# Patient Record
Sex: Female | Born: 1975
Health system: Southern US, Community
[De-identification: ages and names within clinical notes are randomized; demographics above are authoritative.]

## PROBLEM LIST (undated history)

## (undated) DIAGNOSIS — E669 Obesity, unspecified: Secondary | ICD-10-CM

## (undated) DIAGNOSIS — G8929 Other chronic pain: Secondary | ICD-10-CM

## (undated) DIAGNOSIS — I10 Essential (primary) hypertension: Secondary | ICD-10-CM

## (undated) DIAGNOSIS — M1990 Unspecified osteoarthritis, unspecified site: Secondary | ICD-10-CM

## (undated) HISTORY — DX: Essential (primary) hypertension: I10

## (undated) HISTORY — DX: Obesity, unspecified: E66.9

## (undated) HISTORY — DX: Unspecified osteoarthritis, unspecified site: M19.90

## (undated) HISTORY — DX: Other chronic pain: G89.29

---

## 1997-12-24 ENCOUNTER — Other Ambulatory Visit: Admission: RE | Admit: 1997-12-24 | Discharge: 1997-12-24 | Payer: Self-pay | Admitting: Obstetrics

## 1997-12-24 ENCOUNTER — Ambulatory Visit (HOSPITAL_COMMUNITY): Admission: RE | Admit: 1997-12-24 | Discharge: 1997-12-24 | Payer: Self-pay | Admitting: Obstetrics

## 1998-03-13 ENCOUNTER — Inpatient Hospital Stay (HOSPITAL_COMMUNITY): Admission: AD | Admit: 1998-03-13 | Discharge: 1998-03-13 | Payer: Self-pay | Admitting: Obstetrics

## 1998-03-17 ENCOUNTER — Encounter (HOSPITAL_COMMUNITY): Admission: RE | Admit: 1998-03-17 | Discharge: 1998-03-19 | Payer: Self-pay | Admitting: Obstetrics

## 1998-03-18 ENCOUNTER — Inpatient Hospital Stay (HOSPITAL_COMMUNITY): Admission: AD | Admit: 1998-03-18 | Discharge: 1998-03-21 | Payer: Self-pay | Admitting: Obstetrics

## 1998-03-18 ENCOUNTER — Encounter: Payer: Self-pay | Admitting: Obstetrics

## 1998-03-21 ENCOUNTER — Encounter (HOSPITAL_COMMUNITY): Admission: RE | Admit: 1998-03-21 | Discharge: 1998-06-19 | Payer: Self-pay | Admitting: Obstetrics

## 2003-04-18 ENCOUNTER — Emergency Department (HOSPITAL_COMMUNITY): Admission: EM | Admit: 2003-04-18 | Discharge: 2003-04-18 | Payer: Self-pay | Admitting: Emergency Medicine

## 2005-06-08 ENCOUNTER — Emergency Department (HOSPITAL_COMMUNITY): Admission: EM | Admit: 2005-06-08 | Discharge: 2005-06-08 | Payer: Self-pay | Admitting: Emergency Medicine

## 2005-06-10 ENCOUNTER — Encounter: Admission: RE | Admit: 2005-06-10 | Discharge: 2005-06-10 | Payer: Self-pay | Admitting: Family Medicine

## 2005-06-20 ENCOUNTER — Encounter: Admission: RE | Admit: 2005-06-20 | Discharge: 2005-06-20 | Payer: Self-pay | Admitting: Emergency Medicine

## 2005-07-04 ENCOUNTER — Encounter: Admission: RE | Admit: 2005-07-04 | Discharge: 2005-07-04 | Payer: Self-pay | Admitting: Family Medicine
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
## 2017-09-30 ENCOUNTER — Other Ambulatory Visit: Payer: Self-pay

## 2017-09-30 ENCOUNTER — Ambulatory Visit (HOSPITAL_COMMUNITY)
Admission: EM | Admit: 2017-09-30 | Discharge: 2017-09-30 | Disposition: A | Discharge status: Home / Self Care | Payer: BLUE CROSS/BLUE SHIELD | Attending: Family Medicine | Admitting: Family Medicine

## 2017-09-30 ENCOUNTER — Encounter (HOSPITAL_COMMUNITY): Payer: Self-pay | Admitting: Emergency Medicine

## 2017-09-30 DIAGNOSIS — I10 Essential (primary) hypertension: Secondary | ICD-10-CM

## 2017-09-30 DIAGNOSIS — J019 Acute sinusitis, unspecified: Secondary | ICD-10-CM

## 2017-09-30 DIAGNOSIS — J069 Acute upper respiratory infection, unspecified: Secondary | ICD-10-CM | POA: Diagnosis not present

## 2017-09-30 MED ORDER — HYDROCOD POLST-CPM POLST ER 10-8 MG/5ML PO SUER: 5.0000 mL | Freq: Two times a day (BID) | ORAL | 0 refills | Status: AC | PRN

## 2017-09-30 NOTE — Discharge Instructions (Addendum)
Please see your PCP soon for BP management. It was elevated; likely because you were sick. Also you have viral URI. Use Tussionex as instructed. See us soon if symptoms worsens.  Note; Your repeat BP improved. I will recommend PCP follow-up on Monday. Monitor BP at home. If it remains elevated or you start having headache, or dizzy please go to the ED.

## 2017-09-30 NOTE — ED Provider Notes (Signed)
[facility]    CSN: [id number] Arrival date & time: 09/30/17  1215     History   Chief Complaint Chief Complaint  Patient presents with  . URI    HPI Sierra Diaz is a 42 y.o. female.   The history is provided by the patient. No language interpreter was used.  URI  Presenting symptoms: congestion and rhinorrhea   Presenting symptoms: no cough, no ear pain, no facial pain, no fever and no sore throat   Severity:  Moderate Onset quality:  Gradual Duration:  4 days Timing:  Intermittent (She had tooth aches few days ago, but not current. At work today, she developed some tingling on her lips, chest and fingers, but this has since improved a lot) Progression:  Waxing and waning Chronicity:  New Relieved by:  Nothing Worsened by:  Nothing Ineffective treatments: Dayquil, Nyquil. Associated symptoms: headaches, sinus pain and sneezing   Associated symptoms: no wheezing   Risk factors: sick contacts   Risk factors comment:  Her brother is also sick HTN: She has hx but had never been on meds.LMP: 2 weeks ago. Birth control: Abstinence.  History reviewed. No pertinent past medical history.  There are no active problems to display for this patient.   History reviewed. No pertinent surgical history.  OB History   None      Home Medications    Prior to Admission medications   Not on File    Family History Family History  Problem Relation Age of Onset  . Hypertension Mother   . Diabetes Mother   . Hypertension Father   . Diabetes Father   . Diabetes Brother   . Diabetes Brother     Social History Social History   Tobacco Use  . Smoking status: Never Smoker  Substance Use Topics  . Alcohol use: Never    Frequency: Never  . Drug use: Never     Allergies   Patient has no known allergies.   Review of Systems Review of Systems  Constitutional: Negative for fever.  HENT: Positive for congestion, rhinorrhea, sinus pain and sneezing.  Negative for ear pain and sore throat.   Respiratory: Negative for cough and wheezing.   Neurological: Positive for headaches.     Physical Exam Triage Vital Signs ED Triage Vitals  Enc Vitals Group     BP 09/30/17 1314 (!) 166/105     Pulse Rate 09/30/17 1314 94     Resp 09/30/17 1314 20     Temp 09/30/17 1314 98.1 F (36.7 C)     Temp Source 09/30/17 1314 Oral     SpO2 09/30/17 1314 100 %     Weight --      Height --      Head Circumference --      Peak Flow --      Pain Score 09/30/17 1318 3     Pain Loc --      Pain Edu? --      Excl. in GC? --    No data found.  Updated Vital Signs BP (!) 166/105 (BP Location: Left Arm)   Pulse 94   Temp 98.1 F (36.7 C) (Oral)   Resp 20   LMP 09/16/2017   SpO2 100%   Visual Acuity Right Eye Distance:   Left Eye Distance:   Bilateral Distance:    Right Eye Near:   Left Eye Near:    Bilateral Near:     Physical Exam  Constitutional:  She is oriented to person, place, and time. She appears well-developed. No distress.  HENT:  Head: Normocephalic.  Right Ear: Tympanic membrane and ear canal normal.  Left Ear: Tympanic membrane and ear canal normal.  Nose: Right sinus exhibits no maxillary sinus tenderness and no frontal sinus tenderness. Left sinus exhibits no maxillary sinus tenderness and no frontal sinus tenderness.  Mouth/Throat: Oropharynx is clear and moist and mucous membranes are normal. No tonsillar exudate.  Eyes: Pupils are equal, round, and reactive to light. EOM are normal.  Neck: Neck supple.  Cardiovascular: Normal rate, regular rhythm and normal heart sounds.  No murmur heard. Pulmonary/Chest: Effort normal and breath sounds normal. No stridor. No respiratory distress. She has no wheezes.  Abdominal: Soft. Bowel sounds are normal. She exhibits no distension.  Lymphadenopathy:    She has no cervical adenopathy.  Neurological: She is alert and oriented to person, place, and time. No cranial nerve deficit.       UC Treatments / Results  Labs (all labs ordered are listed, but only abnormal results are displayed) Labs Reviewed - No data to display  EKG None  Radiology No results found.  Procedures Procedures (including critical care time)  Medications Ordered in UC Medications - No data to display  Initial Impression / Assessment and Plan / UC Course  I have reviewed the triage vital signs and the nursing notes.  Pertinent labs & imaging results that were available during my care of the patient were reviewed by me and considered in my medical decision making (see chart for details).  Clinical Course as of Oct 01 1411  Sat Sep 30, 2017  1345 Likely viral URI and acute viral sinusitis Tussionex prn cough and nasal congestion. Patient reassured that A/B is not needed at this time. Monitor closely for improvement. F/U soon if worsening.   [KE]  1346 HTN BP elevated today, likely worsened due to her illness. Repeat BP: Improved a little but still diastolic HTN. As discussed with her, this could be due to her acute viral illness and sinusitis. I worry that starting her on antihypertensive now that she is sick might not be the best option as her BP might drop low when she start to feel well. She is to call PCP on Monday for reassessment. Monitor BP closely at home over the weekend, if it remains elevated or she is having headache, or cardiac symptoms, she is advised to go to the ED. She prefers this plan and agreed with it.   [KE]    Clinical Course User Index [KE] Doreene Eland, MD    Viral upper respiratory tract infection  Acute sinusitis, recurrence not specified, unspecified location  Benign essential HTN   Final Clinical Impressions(s) / UC Diagnoses   Final diagnoses:  None   Discharge Instructions   None    ED Prescriptions    None     Controlled Substance Prescriptions [place] Controlled Substance Registry consulted? Not Applicable   Doreene Eland, MD 09/30/17 1413

## 2017-09-30 NOTE — ED Triage Notes (Signed)
Onset Tuesday afternoon of sniffles, progressed to sinus congestion, headache, teeth ache, tingling lips and fingers.  Feels tightness in chest intermittently, sob

## 2018-01-08 DIAGNOSIS — D649 Anemia, unspecified: Secondary | ICD-10-CM | POA: Diagnosis not present

## 2018-01-08 DIAGNOSIS — Z6841 Body Mass Index (BMI) 40.0 and over, adult: Secondary | ICD-10-CM | POA: Diagnosis not present

## 2018-01-08 DIAGNOSIS — N390 Urinary tract infection, site not specified: Secondary | ICD-10-CM | POA: Diagnosis not present

## 2018-01-08 DIAGNOSIS — Z3202 Encounter for pregnancy test, result negative: Secondary | ICD-10-CM | POA: Diagnosis not present

## 2018-01-08 DIAGNOSIS — Z01419 Encounter for gynecological examination (general) (routine) without abnormal findings: Secondary | ICD-10-CM | POA: Diagnosis not present

## 2018-01-10 LAB — HM PAP SMEAR: HM Pap smear: NEGATIVE

## 2018-02-01 DIAGNOSIS — Z1231 Encounter for screening mammogram for malignant neoplasm of breast: Secondary | ICD-10-CM | POA: Diagnosis not present

## 2018-02-01 DIAGNOSIS — N939 Abnormal uterine and vaginal bleeding, unspecified: Secondary | ICD-10-CM | POA: Diagnosis not present

## 2018-02-16 DIAGNOSIS — N939 Abnormal uterine and vaginal bleeding, unspecified: Secondary | ICD-10-CM | POA: Diagnosis not present

## 2018-02-16 DIAGNOSIS — Z3009 Encounter for other general counseling and advice on contraception: Secondary | ICD-10-CM | POA: Diagnosis not present

## 2018-02-16 DIAGNOSIS — D649 Anemia, unspecified: Secondary | ICD-10-CM | POA: Diagnosis not present

## 2018-03-21 DIAGNOSIS — Z30430 Encounter for insertion of intrauterine contraceptive device: Secondary | ICD-10-CM | POA: Diagnosis not present

## 2018-03-21 DIAGNOSIS — N939 Abnormal uterine and vaginal bleeding, unspecified: Secondary | ICD-10-CM | POA: Diagnosis not present

## 2018-05-03 DIAGNOSIS — Z30431 Encounter for routine checking of intrauterine contraceptive device: Secondary | ICD-10-CM | POA: Diagnosis not present

## 2018-09-13 ENCOUNTER — Ambulatory Visit (HOSPITAL_COMMUNITY)
Admission: EM | Admit: 2018-09-13 | Discharge: 2018-09-13 | Disposition: A | Discharge status: Home / Self Care | Payer: BC Managed Care – PPO | Attending: Internal Medicine | Admitting: Internal Medicine

## 2018-09-13 ENCOUNTER — Other Ambulatory Visit: Payer: Self-pay

## 2018-09-13 ENCOUNTER — Encounter (HOSPITAL_COMMUNITY): Payer: Self-pay

## 2018-09-13 DIAGNOSIS — M779 Enthesopathy, unspecified: Secondary | ICD-10-CM

## 2018-09-13 DIAGNOSIS — M25561 Pain in right knee: Secondary | ICD-10-CM | POA: Diagnosis not present

## 2018-09-13 DIAGNOSIS — G8929 Other chronic pain: Secondary | ICD-10-CM

## 2018-09-13 MED ORDER — DICLOFENAC SODIUM 1 % TD GEL: 2.0000 g | Freq: Four times a day (QID) | TRANSDERMAL | 0 refills | Status: DC

## 2018-09-13 NOTE — ED Triage Notes (Signed)
Patient presents to Urgent Care with complaints of right knee pain since a few weeks ago. Patient reports she walks different to try and ease the pain in her knee and now other things are beginning to hurt as well, such as her ankle. Pt has knee brace on at this time.

## 2018-09-13 NOTE — Discharge Instructions (Addendum)
Patient to rest, ice, compress, elevate area of concern throughout the day. Work note provided to decrease time on joint as this can exacerbate swelling and pain. Follow-up with PCP regarding your elevated blood pressure and to monitor right knee pain. Continue Tylenol OTC, use diclofenac topically for additional relief.  Can also use knee compression sleeve for additional support, compression, pain relief.

## 2018-09-13 NOTE — ED Provider Notes (Signed)
[facility]    CSN: [id number] Arrival date & time: 09/13/18  1327     History   Chief Complaint Chief Complaint  Patient presents with  . Knee Pain    HPI Sierra Diaz is a 43 y.o. female presenting for right knee pain.  Patient states she has had chronic knee pain, though for the last 2 weeks has had worsening.  Patient states that she is developed right upper calf pain as well over the last 2 weeks.  Patient denies fall, trauma to the area, rash, joint redness, warmth.  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  Patient denies lower extremity paresthesias, numbness, abnormal gait.  Patient bought a over-the-counter knee brace which is provided some relief.  Of note, patient reports adverse effect of NSAIDs.  States that this elevates her blood pressure, which is already elevated today.  Patient states that she is in the process of establishing care, has not been on any antihypertensive medication before.  Denies alarm symptoms such as chest pain, shortness of breath, abdominal or back pain, headache.   No past medical history on file.  There are no active problems to display for this patient.   No past surgical history on file.  OB History   No obstetric history on file.      Home Medications    Prior to Admission medications   Medication Sig Start Date End Date Taking? Authorizing Provider  ferrous sulfate 325 (65 FE) MG tablet Take 325 mg by mouth daily with breakfast.   Yes [provider]  diclofenac sodium (VOLTAREN) 1 % GEL Apply 2 g topically 4 (four) times daily. 09/13/18   Hall-Potvin, [place], PA-C    Family History Family History  Problem Relation Age of Onset  . Hypertension Mother   . Diabetes Mother   . Hypertension Father   . Diabetes Father   . Diabetes Brother   . Diabetes Brother     Social  History Social History   Tobacco Use  . Smoking status: Never Smoker  Substance Use Topics  . Alcohol use: Never    Frequency: Never  . Drug use: Never     Allergies   Ibuprofen   Review of Systems As per HPI   Physical Exam Triage Vital Signs ED Triage Vitals  Enc Vitals Group     BP      Pulse      Resp      Temp      Temp src      SpO2      Weight      Height      Head Circumference      Peak Flow      Pain Score      Pain Loc      Pain Edu?      Excl. in GC?    No data found.  Updated Vital Signs BP (!) 156/112 (BP Location: Left Arm) Comment: pt was supposed to see PCP to be put on BP meds but did not follow up  Pulse 89   Temp 98.5 F (36.9 C) (Oral)   Resp 18   LMP 09/09/2018 (Exact Date)   SpO2 100%   Visual Acuity Right Eye Distance:   Left Eye Distance:   Bilateral Distance:    Right Eye  Near:   Left Eye Near:    Bilateral Near:     Physical Exam Constitutional:      General: She is not in acute distress.    Appearance: She is obese.  HENT:     Head: Normocephalic and atraumatic.  Eyes:     General: No scleral icterus.    Pupils: Pupils are equal, round, and reactive to light.  Cardiovascular:     Rate and Rhythm: Normal rate.  Pulmonary:     Effort: Pulmonary effort is normal.  Musculoskeletal:     Comments: Right knee with mild edema, negative blotting test.  No significant effusion, anterior patellar tenderness.  Patient has full active ROM without significant pain.  Mild crepitus of right knee.  DP pulses 2+ bilaterally and symmetric.  Patient endorsing tenderness to palpation of right superolateral calf.  Knee joint is stable, negative anterior drawer test.  Patient has normal gait with even distribution of weight through foot.  Skin:    Coloration: Skin is not jaundiced or pale.  Neurological:     Mental Status: She is alert and oriented to person, place, and time.      UC Treatments / Results  Labs (all labs ordered  are listed, but only abnormal results are displayed) Labs Reviewed - No data to display  EKG None  Radiology No results found.  Procedures Procedures (including critical care time)  Medications Ordered in UC Medications - No data to display  Initial Impression / Assessment and Plan / UC Course  I have reviewed the triage vital signs and the nursing notes.  Pertinent labs & imaging results that were available during my care of the patient were reviewed by me and considered in my medical decision making (see chart for details).     43 year old female with history of chronic knee pain bilaterally presenting for worsening pain of right knee and calf.  Likely tendinitis.  Exam reassuring, no indication for x-ray at this time.  Patient given knee compression sleeve, work restrictions, and will practice rest, ice, compression, elevation.  Patient to follow-up with PCP regarding hypertension and to monitor course of knee pain. Final Clinical Impressions(s) / UC Diagnoses   Final diagnoses:  Chronic pain of right knee  Tendonitis     Discharge Instructions     Patient to rest, ice, compress, elevate area of concern throughout the day. Work note provided to decrease time on joint as this can exacerbate swelling and pain. Follow-up with PCP regarding your elevated blood pressure and to monitor right knee pain. Continue Tylenol OTC, use diclofenac topically for additional relief.  Can also use knee compression sleeve for additional support, compression, pain relief.    ED Prescriptions    Medication Sig Dispense Auth. Provider   diclofenac sodium (VOLTAREN) 1 % GEL Apply 2 g topically 4 (four) times daily. 100 g Hall-Potvin, [place], PA-C     Controlled Substance Prescriptions [place] Controlled Substance Registry consulted? Not Applicable   Quincy Sheehan, [place] 09/13/18 1454

## 2018-09-23 DIAGNOSIS — M25561 Pain in right knee: Secondary | ICD-10-CM | POA: Diagnosis not present

## 2018-11-07 ENCOUNTER — Encounter: Payer: Self-pay | Admitting: Family Medicine

## 2018-11-19 ENCOUNTER — Encounter: Payer: Self-pay | Admitting: Family Medicine

## 2018-11-19 ENCOUNTER — Ambulatory Visit (INDEPENDENT_AMBULATORY_CARE_PROVIDER_SITE_OTHER): Payer: BC Managed Care – PPO | Admitting: Family Medicine

## 2018-11-19 ENCOUNTER — Other Ambulatory Visit: Payer: Self-pay

## 2018-11-19 DIAGNOSIS — I10 Essential (primary) hypertension: Secondary | ICD-10-CM

## 2018-11-19 DIAGNOSIS — G8929 Other chronic pain: Secondary | ICD-10-CM

## 2018-11-19 DIAGNOSIS — M25561 Pain in right knee: Secondary | ICD-10-CM

## 2018-11-19 MED ORDER — AMLODIPINE BESYLATE 5 MG PO TABS: 5.0000 mg | ORAL_TABLET | Freq: Every day | ORAL | 3 refills | Status: DC

## 2018-11-19 NOTE — Progress Notes (Signed)
Following up on urgent care visit from June 2020. States that her knee pain has gotten better. Previously had constant pain with or without movement. Now only has pain if she's moving around for long periods of time. Has been wearing a knee brace.

## 2018-11-19 NOTE — Progress Notes (Signed)
Virtual Visit via Telephone Note  I connected with Sierra Diaz 11/19/18 at  1:50 PM EDT by telephone and verified that I am speaking with the correct person using two identifiers.   I discussed the limitations, risks, security and privacy concerns of performing an evaluation and management service by telephone and the availability of in person appointments. I also discussed with the patient that there may be a patient responsible charge related to this service. The patient expressed understanding and agreed to proceed.  Patient Location: Home Provider Location: Office at [facility] Others participating in call: Call was initiated by Adrian Prince, CMA who then transferred the call to me   History of Present Illness:        43 yo female who states that she is establishing care in follow-up of an urgent care visit in June of this year when she was seen for knee pain.  Patient reports that she has had recurrent issues with pain in the right knee and has been diagnosed with tendinitis in the past.  She states that she was having constant pain but now her knee pain occurs mostly after she has been sitting/not using her knee and then when she tries to get up to start walking again she has onset of pain.  Pain is located in the right inside of the knee and slightly behind the knee.  Generally pain is a dull aching sensation but can sometimes be sharp.  Current pain is about a 4-5 on a 0-to-10 scale.  Pain has been greater in the past.  She has been wearing a knee sleeve prescribed at the emergency department.          Patient states that she has been taking over-the-counter Aleve and Tylenol but stopped using these as they really were not helpful.  Patient states that in the past she took ibuprofen which did help with her knee pain but this would also tend to cause her to have a headache and to have elevations in her blood pressure.  Patient reports that at her last emergency department/urgent care visit  for her knee she was told that her blood pressure was elevated and that she needed to establish follow-up with her primary care provider.  She reports that she has had elevated blood pressure in the past but has never been on medication to help control her blood pressure.  She did try the use of diclofenac gel prescribed at at her most recent hospital visit about her knee pain but she states that this was not effective and she is no longer using this medication.        She does have past history of anemia secondary to blood loss related to menses.  She does still take iron on occasion but has had no recent follow-up regarding anemia and she states that her bleeding has improved.  She does have an IUD in place.  She denies any other significant medical history other than her elevated blood pressure and recurrent knee pain.  Past surgical history consist of C-section approximately 20 years ago.  She does have family history significant for both mother and father with diabetes and hypertension.   Past Medical History:  Diagnosis Date  . Chronic knee pain   . Obesity     Past Surgical History:  Procedure Laterality Date  . CESAREAN SECTION      Family History  Problem Relation Age of Onset  . Hypertension Mother   . Diabetes Mother   .  Hypertension Father   . Diabetes Father   . Diabetes Brother   . Diabetes Brother     Social History   Tobacco Use  . Smoking status: Never Smoker  . Smokeless tobacco: Never Used  Substance Use Topics  . Alcohol use: Never    Frequency: Never  . Drug use: Never     Allergies  Allergen Reactions  . Ibuprofen     "makes my blood pressure shoot up"       Observations/Objective: No vital signs or physical exam conducted as visit was done via telephone  Assessment and Plan: 1. Essential hypertension On review of chart, patient has had blood pressure reading of 166/105 at an urgent care visit on 09/30/2017 as well as blood pressure of 156/112 on her  urgent care visit on 09/13/2018.  Discussed with patient that she is considered to have hypertension.  Prescription will be sent to her pharmacy for amlodipine 5 mg once daily to help lower blood pressure.  She was asked to make an actual office visit within the next 2 to 3 weeks to have her blood pressure checked and she will likely also need BMP to check creatinine for any signs of end-stage organ damage related to her untreated hypertension.  Low-sodium diet also recommended  2. Chronic pain of right knee Patient with complaint of chronic right knee pain.  Per urgent care note from 09/13/2018 patient was diagnosed with tendinitis but I suspect that she may also have some osteoarthritis.  Patient currently is not taking any medication as pain has decreased and she does not feel that diclofenac gel, Tylenol or naproxen helps with her knee pain and patient is not taking ibuprofen as it causes her to feel short of breath and have elevated blood pressure. - AMB referral to orthopedics  Follow Up Instructions:Return in about 3 weeks (around 12/10/2018) for HTN- f/u in 2-3 weeks.    I discussed the assessment and treatment plan with the patient. The patient was provided an opportunity to ask questions and all were answered. The patient agreed with the plan and demonstrated an understanding of the instructions.   The patient was advised to call back or seek an in-person evaluation if the symptoms worsen or if the condition fails to improve as anticipated.  I provided 14 minutes of non-face-to-face time during this encounter.   Cain Saupeammie Roddie Riegler, MD

## 2018-11-30 ENCOUNTER — Telehealth: Payer: Self-pay

## 2018-11-30 NOTE — Telephone Encounter (Signed)
Called patient to do their pre-visit COVID screening.  Call went to voicemail. Unable to do prescreening.  

## 2018-12-03 ENCOUNTER — Ambulatory Visit (INDEPENDENT_AMBULATORY_CARE_PROVIDER_SITE_OTHER): Payer: BC Managed Care – PPO | Admitting: Family Medicine

## 2018-12-03 ENCOUNTER — Other Ambulatory Visit: Payer: Self-pay

## 2018-12-03 ENCOUNTER — Encounter: Payer: Self-pay | Admitting: Family Medicine

## 2018-12-03 VITALS — BP 139/90 | HR 90 | Temp 97.3°F | Resp 17 | Ht 64.0 in | Wt 279.8 lb

## 2018-12-03 DIAGNOSIS — I10 Essential (primary) hypertension: Secondary | ICD-10-CM

## 2018-12-03 DIAGNOSIS — Z23 Encounter for immunization: Secondary | ICD-10-CM | POA: Diagnosis not present

## 2018-12-03 DIAGNOSIS — G8929 Other chronic pain: Secondary | ICD-10-CM

## 2018-12-03 DIAGNOSIS — M25561 Pain in right knee: Secondary | ICD-10-CM

## 2018-12-03 DIAGNOSIS — Z79899 Other long term (current) drug therapy: Secondary | ICD-10-CM | POA: Diagnosis not present

## 2018-12-03 MED ORDER — TRIAMCINOLONE ACETONIDE 0.1 % EX CREA: 1.0000 "application " | TOPICAL_CREAM | Freq: Two times a day (BID) | CUTANEOUS | 0 refills | Status: DC

## 2018-12-03 MED ORDER — AMLODIPINE BESYLATE 5 MG PO TABS: 5.0000 mg | ORAL_TABLET | Freq: Every day | ORAL | 1 refills | Status: DC

## 2018-12-03 NOTE — Progress Notes (Deleted)
Established Patient Office Visit  Subjective:  Patient ID: Sierra Diaz, female    DOB: 24-Dec-1975  Age: 43 y.o. MRN: [id number]  CC:  Chief Complaint  Patient presents with  . Hypertension  . Knee Pain    HPI 88 F Sierra Diaz presents for ***  Past Medical History:  Diagnosis Date  . Chronic knee pain   . Obesity     Past Surgical History:  Procedure Laterality Date  . CESAREAN SECTION      Family History  Problem Relation Age of Onset  . Hypertension Mother   . Diabetes Mother   . Hypertension Father   . Diabetes Father   . Diabetes Brother   . Diabetes Brother     Social History   Socioeconomic History  . Marital status: Single    Spouse name: Not on file  . Number of children: Not on file  . Years of education: Not on file  . Highest education level: Not on file  Occupational History  . Not on file  Social Needs  . Financial resource strain: Not on file  . Food insecurity    Worry: Not on file    Inability: Not on file  . Transportation needs    Medical: Not on file    Non-medical: Not on file  Tobacco Use  . Smoking status: Never Smoker  . Smokeless tobacco: Never Used  Substance and Sexual Activity  . Alcohol use: Never    Frequency: Never  . Drug use: Never  . Sexual activity: Not on file  Lifestyle  . Physical activity    Days per week: Not on file    Minutes per session: Not on file  . Stress: Not on file  Relationships  . Social Herbalist on phone: Not on file    Gets together: Not on file    Attends religious service: Not on file    Active member of club or organization: Not on file    Attends meetings of clubs or organizations: Not on file    Relationship status: Not on file  . Intimate partner violence    Fear of current or ex partner: Not on file    Emotionally abused: Not on file    Physically abused: Not on file    Forced sexual activity: Not on file  Other Topics Concern  . Not on file  Social  History Narrative  . Not on file    Outpatient Medications Prior to Visit  Medication Sig Dispense Refill  . amLODipine (NORVASC) 5 MG tablet Take 1 tablet (5 mg total) by mouth daily. To lower blood pressure 30 tablet 3  . ferrous sulfate 325 (65 FE) MG tablet Take 325 mg by mouth daily with breakfast.     No facility-administered medications prior to visit.     Allergies  Allergen Reactions  . Ibuprofen     "makes my blood pressure shoot up"    ROS Review of Systems    Objective:    Physical Exam  BP 139/90   Pulse 90   Temp (!) 97.3 F (36.3 C) (Temporal)   Resp 17   Ht 5' 4"  (1.626 m)   Wt 279 lb 12.8 oz (126.9 kg)   SpO2 97%   BMI 48.03 kg/m  Wt Readings from Last 3 Encounters:  12/03/18 279 lb 12.8 oz (126.9 kg)     Health Maintenance Due  Topic Date Due  . HIV Screening  09/01/1990  .  TETANUS/TDAP  09/01/1994  . PAP SMEAR-Modifier  08/31/1996  . INFLUENZA VACCINE  11/03/2018    There are no preventive care reminders to display for this patient.  No results found for: TSH No results found for: WBC, HGB, HCT, MCV, PLT No results found for: NA, K, CHLORIDE, CO2, GLUCOSE, BUN, CREATININE, BILITOT, ALKPHOS, AST, ALT, PROT, ALBUMIN, CALCIUM, ANIONGAP, EGFR, GFR No results found for: CHOL No results found for: HDL No results found for: LDLCALC No results found for: TRIG No results found for: CHOLHDL No results found for: HGBA1C    Assessment & Plan:   Problem List Items Addressed This Visit    None      No orders of the defined types were placed in this encounter.   Follow-up: No follow-ups on file.    Antony Blackbird, MD

## 2018-12-03 NOTE — Progress Notes (Signed)
Established Patient Office Visit  Subjective:  Patient ID: Sierra Diaz, female    DOB: 1975-06-18  Age: 43 y.o. MRN: [id number]  CC:  Chief Complaint  Patient presents with  . Hypertension  . Knee Pain    HPI Sierra Diaz presents for follow-up of hypertension and right knee pain.  She is status post virtual visit on 11/19/2018 in order to establish care.  Patient was prescribed amlodipine 5 mg at her last visit.  She states that she is taking the medication without any side effects.  She does feel as if the medication has been working and she is not having any issues with headaches or dizziness.  She denies any issues with an increase in peripheral edema after starting the use of amlodipine.        She continues to have issues with pain in both knees but mostly in the right knee.  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  She tends to have more pain when she is walking especially if she is up or down stairs.  Patient also feels that pain is increased if she has been sitting and then attempts to stand up.  Pain is mostly on the right inside of her knee and slightly behind the right knee.  Patient takes over-the-counter Tylenol or naproxen to help with the pain.  Without pain medication her knee pain is about an 8 and with pain medication knee pain can range from a 4 to a 6.  Patient sometimes has no pain at all when she is sitting still.  Past Medical History:  Diagnosis Date  . Chronic knee pain   . Obesity     Past Surgical History:  Procedure Laterality Date  . CESAREAN SECTION      Family History  Problem Relation Age of Onset  . Hypertension Mother   . Diabetes Mother   . Hypertension Father   . Diabetes Father   . Diabetes Brother   . Diabetes Brother     Social History   Socioeconomic History  . Marital status: Single    Spouse name: Not on file  . Number  of children: Not on file  . Years of education: Not on file  . Highest education level: Not on file  Occupational History  . Not on file  Social Needs  . Financial resource strain: Not on file  . Food insecurity    Worry: Not on file    Inability: Not on file  . Transportation needs    Medical: Not on file    Non-medical: Not on file  Tobacco Use  . Smoking status: Never Smoker  . Smokeless tobacco: Never Used  Substance and Sexual Activity  . Alcohol use: Never    Frequency: Never  . Drug use: Never  . Sexual activity: Not on file  Lifestyle  . Physical activity    Days per week: Not on file    Minutes per session: Not on file  . Stress: Not on file  Relationships  . Social Herbalist on phone: Not on file    Gets together: Not on file    Attends religious service: Not on file    Active member of club or organization: Not on file    Attends meetings of clubs or organizations: Not on file    Relationship status: Not  on file  . Intimate partner violence    Fear of current or ex partner: Not on file    Emotionally abused: Not on file    Physically abused: Not on file    Forced sexual activity: Not on file  Other Topics Concern  . Not on file  Social History Narrative  . Not on file    Outpatient Medications Prior to Visit  Medication Sig Dispense Refill  . amLODipine (NORVASC) 5 MG tablet Take 1 tablet (5 mg total) by mouth daily. To lower blood pressure 30 tablet 3  . ferrous sulfate 325 (65 FE) MG tablet Take 325 mg by mouth daily with breakfast.     No facility-administered medications prior to visit.     Allergies  Allergen Reactions  . Ibuprofen     "makes my blood pressure shoot up"    ROS Review of Systems  Constitutional: Positive for fatigue (Mild). Negative for chills and fever.  HENT: Negative for sore throat and trouble swallowing.   Eyes: Negative for photophobia and visual disturbance.  Respiratory: Negative for cough and shortness  of breath.   Cardiovascular: Negative for chest pain and palpitations.  Gastrointestinal: Negative for abdominal pain, blood in stool, constipation, diarrhea and nausea.  Endocrine: Negative for polydipsia, polyphagia and polyuria.  Genitourinary: Negative for dysuria and frequency.  Musculoskeletal: Positive for arthralgias, gait problem and joint swelling (Occasional).  Neurological: Negative for dizziness and headaches.  Hematological: Negative for adenopathy. Does not bruise/bleed easily.      Objective:    Physical Exam  Constitutional: She is oriented to person, place, and time. She appears well-developed and well-nourished.  Obese female in NAD  Neck: Normal range of motion. Neck supple. No JVD present.  Cardiovascular: Normal rate and regular rhythm.  Pulmonary/Chest: Effort normal and breath sounds normal.  Abdominal: Soft. There is no abdominal tenderness. There is no rebound and no guarding.  Musculoskeletal:        General: Tenderness (right joint line tenderness and some discomfort with mild stress applied to the MCL) and edema (mild generalized puffiness of the right knee) present.     Comments: No CVA tenderness  Lymphadenopathy:    She has no cervical adenopathy.  Neurological: She is alert and oriented to person, place, and time.  Skin: Skin is warm and dry.  Psychiatric: She has a normal mood and affect. Her behavior is normal.  Nursing note and vitals reviewed.   BP 139/90   Pulse 90   Temp (!) 97.3 F (36.3 C) (Temporal)   Resp 17   Ht _0  (1.626 m)   Wt 279 lb 12.8 oz (126.9 kg)   SpO2 97%   BMI 48.03 kg/m  Wt Readings from Last 3 Encounters:  12/03/18 279 lb 12.8 oz (126.9 kg)     Health Maintenance Due  Topic Date Due  . HIV Screening  09/01/1990  . TETANUS/TDAP  09/01/1994  . PAP SMEAR-Modifier  08/31/1996  . INFLUENZA VACCINE  11/03/2018      No results found for: TSH No results found for: WBC, HGB, HCT, MCV, PLT No results found  for: NA, K, CHLORIDE, CO2, GLUCOSE, BUN, CREATININE, BILITOT, ALKPHOS, AST, ALT, PROT, ALBUMIN, CALCIUM, ANIONGAP, EGFR, GFR No results found for: CHOL No results found for: HDL No results found for: LDLCALC No results found for: TRIG No results found for: CHOLHDL No results found for: HGBA1C    Assessment & Plan:  1. Essential hypertension Patient's blood pressures improved and  she is encouraged to continue the use of amlodipine.  She is also encouraged to follow a low-sodium diet and engage in efforts at weight loss. - Basic Metabolic Panel - amLODipine (NORVASC) 5 MG tablet; Take 1 tablet (5 mg total) by mouth daily. To lower blood pressure  Dispense: 90 tablet; Refill: 1  2. Chronic pain of right knee Patient with chronic right knee pain and she does have follow-up with orthopedics later this week as referral was made at her initial visit.  3. Encounter for long-term current use of medication Patient will have basic metabolic panel today in follow-up of long-term use of medication for the treatment of hypertension as well as the use of over-the-counter medications for knee pain. - Basic Metabolic Panel  4. Need for Tdap vaccination Patient was offered and agreed to received tetanus immunization at today's visit.  Patient was also given educational handout regarding the vaccination.  Possible side effects/complications discussed with the patient and she should call or return if concerning side effects/complications occur. - Tdap vaccine greater than or equal to 7yo IM  *Patient was offered influenza immunization at today's visit which she declined as she believes that this may be given at her workplace.  An After Visit Summary was printed and given to the patient.  Follow-up: Return in about 3 months (around 03/04/2019) for HTN and as needed; well exam as needed.   Antony Blackbird, MD

## 2018-12-03 NOTE — Progress Notes (Signed)
Doesn't check BP at home. Denies chest pain, SHOB, headaches, dizziness, palpitations.  States that R knee pain has gotten worse. Is walking with a limp. Hasn't had any falls. States that pain isn't constant(only when weight bearing).

## 2018-12-03 NOTE — Patient Instructions (Signed)

## 2018-12-04 LAB — BASIC METABOLIC PANEL WITH GFR
BUN/Creatinine Ratio: 19 (ref 9–23)
BUN: 13 mg/dL (ref 6–24)
CO2: 24 mmol/L (ref 20–29)
Calcium: 8.9 mg/dL (ref 8.7–10.2)
Chloride: 103 mmol/L (ref 96–106)
Creatinine, Ser: 0.68 mg/dL (ref 0.57–1.00)
GFR calc Af Amer: 124 mL/min/1.73
GFR calc non Af Amer: 107 mL/min/1.73
Glucose: 107 mg/dL — ABNORMAL HIGH (ref 65–99)
Potassium: 3.9 mmol/L (ref 3.5–5.2)
Sodium: 140 mmol/L (ref 134–144)

## 2018-12-11 ENCOUNTER — Ambulatory Visit (INDEPENDENT_AMBULATORY_CARE_PROVIDER_SITE_OTHER): Payer: BC Managed Care – PPO | Admitting: Orthopaedic Surgery

## 2018-12-11 ENCOUNTER — Other Ambulatory Visit: Payer: Self-pay

## 2018-12-11 ENCOUNTER — Encounter: Payer: Self-pay | Admitting: Orthopaedic Surgery

## 2018-12-11 ENCOUNTER — Ambulatory Visit: Payer: Self-pay

## 2018-12-11 DIAGNOSIS — G8929 Other chronic pain: Secondary | ICD-10-CM | POA: Diagnosis not present

## 2018-12-11 DIAGNOSIS — M25561 Pain in right knee: Secondary | ICD-10-CM

## 2018-12-11 MED ORDER — BUPIVACAINE HCL 0.25 % IJ SOLN
2.0000 mL | INTRAMUSCULAR | Status: AC | PRN
  Administered 2018-12-11: 2 mL via INTRA_ARTICULAR

## 2018-12-11 MED ORDER — LIDOCAINE HCL 1 % IJ SOLN
2.0000 mL | INTRAMUSCULAR | Status: AC | PRN
  Administered 2018-12-11: 2 mL

## 2018-12-11 MED ORDER — METHYLPREDNISOLONE ACETATE 40 MG/ML IJ SUSP
40.0000 mg | INTRAMUSCULAR | Status: AC | PRN
  Administered 2018-12-11: 40 mg via INTRA_ARTICULAR

## 2018-12-11 NOTE — Progress Notes (Signed)
   Office Visit Note   Patient: Sierra Diaz           Date of Birth: 11-03-1975           MRN: [id number] Visit Date: 12/11/2018              Requested by: Antony Blackbird, MD [place],  [place] [id number] PCP: Patient, No Pcp Per   Assessment & Plan: Visit Diagnoses:  1. Chronic pain of right knee     Plan: Impression is right knee osteoarthritis flareup.  We will inject this with cortisone today.  She will follow-up with [place] as needed.  Follow-Up Instructions: Return if symptoms worsen or fail to improve.   Orders:  Orders Placed This Encounter  Procedures  . Large Joint Inj: R knee  . XR KNEE 3 VIEW RIGHT   No orders of the defined types were placed in this encounter.     Procedures: Large Joint Inj: R knee on 12/11/2018 1:33 PM Indications: pain Details: 22 G needle, anterolateral approach Medications: 2 mL bupivacaine 0.25 %; 2 mL lidocaine 1 %; 40 mg methylPREDNISolone acetate 40 MG/ML      Clinical Data: No additional findings.   Subjective: Chief Complaint  Patient presents with  . Right Knee - Pain    HPI patient is a pleasant 43 year old female who presents to clinic today with right knee pain.  This has been ongoing for the past few months and has progressively worsened.  No known injury or change in activity.  Majority of her pain is to the popliteal fossa.  She describes this as a deep ache without any mechanical symptoms.  Pain is worse with increased activity as well is when she goes from a seated to standing position.  She has been taking over-the-counter anti-inflammatories and Tylenol with mild relief of symptoms.  No previous cortisone injection or surgical intervention.  Review of Systems as detailed in HPI.  All others reviewed and are negative.   Objective: Vital Signs: There were no vitals taken for this visit.  Physical Exam well-developed well-nourished female no acute distress.  Alert and oriented x3.  Ortho Exam  examination of her right knee shows no effusion.  Range of motion 0 to 125 degrees.  Mild tenderness to the popliteal fossa.  Mild joint line tenderness medial lateral stable valgus varus stress.  Moderate patellofemoral crepitus.  She is neurovascular intact distally.  Specialty Comments:  No specialty comments available.  Imaging: Xr Knee 3 View Right  Result Date: 12/11/2018 X-rays demonstrate mild to moderate joint space narrowing medial and patellofemoral compartments    PMFS History: There are no active problems to display for this patient.  Past Medical History:  Diagnosis Date  . Chronic knee pain   . Obesity     Family History  Problem Relation Age of Onset  . Hypertension Mother   . Diabetes Mother   . Hypertension Father   . Diabetes Father   . Diabetes Brother   . Diabetes Brother     Past Surgical History:  Procedure Laterality Date  . CESAREAN SECTION     Social History   Occupational History  . Not on file  Tobacco Use  . Smoking status: Never Smoker  . Smokeless tobacco: Never Used  Substance and Sexual Activity  . Alcohol use: Never    Frequency: Never  . Drug use: Never  . Sexual activity: Not on file

## 2019-02-27 ENCOUNTER — Telehealth: Payer: Self-pay

## 2019-02-27 NOTE — Telephone Encounter (Signed)
Called patient to do their pre-visit COVID screening.  Call went to voicemail. Unable to do prescreening.  

## 2019-03-04 ENCOUNTER — Ambulatory Visit (INDEPENDENT_AMBULATORY_CARE_PROVIDER_SITE_OTHER): Payer: BC Managed Care – PPO | Admitting: Family Medicine

## 2019-03-04 ENCOUNTER — Other Ambulatory Visit: Payer: Self-pay

## 2019-03-04 VITALS — BP 164/102 | HR 86 | Temp 97.2°F | Resp 17 | Wt 274.6 lb

## 2019-03-04 DIAGNOSIS — I10 Essential (primary) hypertension: Secondary | ICD-10-CM | POA: Diagnosis not present

## 2019-03-04 DIAGNOSIS — Z13220 Encounter for screening for lipoid disorders: Secondary | ICD-10-CM

## 2019-03-04 DIAGNOSIS — Z0000 Encounter for general adult medical examination without abnormal findings: Secondary | ICD-10-CM

## 2019-03-04 DIAGNOSIS — Z6841 Body Mass Index (BMI) 40.0 and over, adult: Secondary | ICD-10-CM | POA: Diagnosis not present

## 2019-03-04 DIAGNOSIS — Z0001 Encounter for general adult medical examination with abnormal findings: Secondary | ICD-10-CM | POA: Diagnosis not present

## 2019-03-04 DIAGNOSIS — Z131 Encounter for screening for diabetes mellitus: Secondary | ICD-10-CM

## 2019-03-04 MED ORDER — AMLODIPINE BESYLATE 10 MG PO TABS: 10.0000 mg | ORAL_TABLET | Freq: Every day | ORAL | 4 refills | Status: DC

## 2019-03-04 NOTE — Progress Notes (Signed)
Here for CPE.  Declines pap smear today. States that OBGYN does those. See [facility].

## 2019-03-04 NOTE — Progress Notes (Signed)
Established Patient Office Visit  Subjective:  Patient ID: Sierra Diaz, female    DOB: 1975-10-26  Age: 43 y.o. MRN: [id number]  CC:  Chief Complaint  Patient presents with  . Annual Exam    HPI Sierra MariscalCandace F Christian, 43 yo female with medical issues including Hypertension, Obesity and chronic right knee pain due to primary OA, who is being seen for her annual well exam.  She reports that she had GYN appointment in December 2019 or Jan 2020 for IUD placement and thinks that her pap smear was also done at that time.  No current abdominal or pelvic pain. No current issues with vaginal discharge. She reports that her mammogram was also done by her GYN's office and she believes that this is also up to date. She has found no abnormalities on self breast exam. No current issues with breast pain, skin changes or nipple discharge.         She did see Orthopedics since her last visit and has had an injection in her right knee which has helped decrease her knee pain. She is taking her blood pressure medication daily and denies any headaches or dizziness related to her blood pressure. She feels as if her health is stable at this time. She feels that her knee pain limits her ability to lose weight.   Past Medical History:  Diagnosis Date  . Chronic knee pain   . Obesity     Past Surgical History:  Procedure Laterality Date  . CESAREAN SECTION      Family History  Problem Relation Age of Onset  . Hypertension Mother   . Diabetes Mother   . Hypertension Father   . Diabetes Father   . Diabetes Brother   . Diabetes Brother     Social History   Socioeconomic History  . Marital status: Single    Spouse name: Not on file  . Number of children: Not on file  . Years of education: Not on file  . Highest education level: Not on file  Occupational History  . Not on file  Social Needs  . Financial resource strain: Not on file  . Food insecurity    Worry: Not on file    Inability: Not  on file  . Transportation needs    Medical: Not on file    Non-medical: Not on file  Tobacco Use  . Smoking status: Never Smoker  . Smokeless tobacco: Never Used  Substance and Sexual Activity  . Alcohol use: Never    Frequency: Never  . Drug use: Never  . Sexual activity: Not on file  Lifestyle  . Physical activity    Days per week: Not on file    Minutes per session: Not on file  . Stress: Not on file  Relationships  . Social Musicianconnections    Talks on phone: Not on file    Gets together: Not on file    Attends religious service: Not on file    Active member of club or organization: Not on file    Attends meetings of clubs or organizations: Not on file    Relationship status: Not on file  . Intimate partner violence    Fear of current or ex partner: Not on file    Emotionally abused: Not on file    Physically abused: Not on file    Forced sexual activity: Not on file  Other Topics Concern  . Not on file  Social History Narrative  .  Not on file    Outpatient Medications Prior to Visit  Medication Sig Dispense Refill  . amLODipine (NORVASC) 5 MG tablet Take 1 tablet (5 mg total) by mouth daily. To lower blood pressure 90 tablet 1  . ferrous sulfate 325 (65 FE) MG tablet Take 325 mg by mouth daily with breakfast.    . triamcinolone cream (KENALOG) 0.1 % Apply 1 application topically 2 (two) times daily. 30 g 0   No facility-administered medications prior to visit.     Allergies  Allergen Reactions  . Ibuprofen     "makes my blood pressure shoot up"    ROS Review of Systems  Constitutional: Positive for fatigue. Negative for chills and fever.  HENT: Negative for sore throat and trouble swallowing.   Eyes: Negative for photophobia and visual disturbance.  Respiratory: Negative for cough and shortness of breath.   Cardiovascular: Negative for chest pain and palpitations.  Gastrointestinal: Negative for abdominal pain, blood in stool, constipation, diarrhea and  nausea.  Endocrine: Negative for cold intolerance, heat intolerance, polydipsia, polyphagia and polyuria.  Genitourinary: Negative for dysuria and frequency.  Musculoskeletal: Positive for arthralgias, gait problem and joint swelling.  Neurological: Negative for dizziness and headaches.  Hematological: Negative for adenopathy. Does not bruise/bleed easily.  Psychiatric/Behavioral: Negative for self-injury and suicidal ideas.      Objective:    Physical Exam  Constitutional: She is oriented to person, place, and time. She appears well-developed and well-nourished.  WNWD obese female in NAD wearing a mask as per office COVID-19 protocol  Neck: No JVD present. No thyromegaly present.  Cardiovascular: Normal rate and regular rhythm.  Pulmonary/Chest: Effort normal and breath sounds normal.  Abdominal: Soft. There is no abdominal tenderness. There is no rebound and no guarding.  Musculoskeletal:        General: Tenderness (joint line tenderness of the right knee) present. No edema.     Cervical back: Normal range of motion and neck supple.  Lymphadenopathy:    She has no cervical adenopathy.  Neurological: She is alert and oriented to person, place, and time.  Skin: Skin is warm and dry.  Psychiatric: She has a normal mood and affect. Her behavior is normal.  Nursing note and vitals reviewed.   BP (!) 153/104   Pulse 86   Temp (!) 97.2 F (36.2 C) (Temporal)   Resp 17   Wt 274 lb 9.6 oz (124.6 kg)   SpO2 98%   BMI 47.13 kg/m  Repeat blood pressure of 164/102 Wt Readings from Last 3 Encounters:  12/03/18 279 lb 12.8 oz (126.9 kg)     Health Maintenance Due  Topic Date Due  . HIV Screening  09/01/1990  . PAP SMEAR-Modifier  08/31/1996    No results found for: TSH No results found for: WBC, HGB, HCT, MCV, PLT Lab Results  Component Value Date   NA 140 12/03/2018   K 3.9 12/03/2018   CO2 24 12/03/2018   GLUCOSE 107 (H) 12/03/2018   BUN 13 12/03/2018   CREATININE 0.68  12/03/2018   CALCIUM 8.9 12/03/2018   No results found for: CHOL No results found for: HDL No results found for: LDLCALC No results found for: TRIG No results found for: CHOLHDL No results found for: HGBA1C    Assessment & Plan:  1. Encounter for preventative adult health care examination; 2. Screening for lipid disorders; Screening for DM Patient reports that she saw GYN in December and January and had placement of IUD and she  believes that her Pap smear may have been done during this timeframe as well.  She also reports that her mammogram was done by her GYN office.  She will be referred back to GYN regarding follow-up of pelvic/Pap smear as there are no notes in her chart indicating recent Pap smear.  She will return for fasting blood work including lipid panel, BMP and hemoglobin A1c.  She did have glucose of 107 on recent blood work but blood work may have been nonfasting and therefore normal glucose level.  Immunizations are currently up-to-date.  She is not interested in having screening for HIV.  Healthy diet and regular exercise encouraged. - Ambulatory referral to Gynecology - Lipid Panel; Future - Hemoglobin A1C; Future - Basic Metabolic Panel; Future  4. Essential Hypertension She reports compliance with her current blood pressure medication, amlodipine 5 mg however at today's visit, her blood pressure remains elevated and above goal of less than 140/90.  Patient's amlodipine will be increased to 10 mg daily.  Low-sodium diet and weight loss encouraged.  Return to clinic in 2 to 3 weeks for blood pressure recheck and lab visit.  5.  Morbid obesity Patient with BMI of 47.  Weight loss would help with patient's hypertension as well as knee pain from osteoarthritis.  At next visit, will discuss referral to medical weight loss program/bariatric surgery.  An After Visit Summary was printed and given to the patient.  Follow-up: Return in about 10 weeks (around 05/13/2019) for HTN;  schedule fasting lab visit & BP check in 2-3 weeks.    Cain Saupe, MD

## 2019-03-04 NOTE — Patient Instructions (Signed)

## 2019-03-25 ENCOUNTER — Ambulatory Visit (INDEPENDENT_AMBULATORY_CARE_PROVIDER_SITE_OTHER): Payer: BC Managed Care – PPO

## 2019-03-25 VITALS — BP 148/92 | HR 75 | Resp 17

## 2019-03-25 DIAGNOSIS — Z0000 Encounter for general adult medical examination without abnormal findings: Secondary | ICD-10-CM | POA: Diagnosis not present

## 2019-03-25 DIAGNOSIS — Z131 Encounter for screening for diabetes mellitus: Secondary | ICD-10-CM | POA: Diagnosis not present

## 2019-03-25 DIAGNOSIS — Z13220 Encounter for screening for lipoid disorders: Secondary | ICD-10-CM | POA: Diagnosis not present

## 2019-03-25 DIAGNOSIS — I10 Essential (primary) hypertension: Secondary | ICD-10-CM

## 2019-03-25 NOTE — Progress Notes (Signed)
Patient here for BP check & fasting labs. Has taken BP medication prior to appointment. After sitting BP was 148/92, pulse was 75. Spoke with provider & she states to have patient continue current medication & to keep scheduled follow up visit.Marland Kitchen KWalker, CMA.

## 2019-03-26 LAB — BASIC METABOLIC PANEL WITH GFR
BUN/Creatinine Ratio: 19 (ref 9–23)
BUN: 13 mg/dL (ref 6–24)
CO2: 22 mmol/L (ref 20–29)
Calcium: 9.1 mg/dL (ref 8.7–10.2)
Chloride: 102 mmol/L (ref 96–106)
Creatinine, Ser: 0.68 mg/dL (ref 0.57–1.00)
GFR calc Af Amer: 124 mL/min/1.73
GFR calc non Af Amer: 107 mL/min/1.73
Glucose: 77 mg/dL (ref 65–99)
Potassium: 3.9 mmol/L (ref 3.5–5.2)
Sodium: 139 mmol/L (ref 134–144)

## 2019-03-26 LAB — LIPID PANEL
Chol/HDL Ratio: 2.7 ratio (ref 0.0–4.4)
Cholesterol, Total: 133 mg/dL (ref 100–199)
HDL: 49 mg/dL
LDL Chol Calc (NIH): 73 mg/dL (ref 0–99)
Triglycerides: 45 mg/dL (ref 0–149)
VLDL Cholesterol Cal: 11 mg/dL (ref 5–40)

## 2019-03-26 LAB — HEMOGLOBIN A1C
Est. average glucose Bld gHb Est-mCnc: 105 mg/dL
Hgb A1c MFr Bld: 5.3 % (ref 4.8–5.6)

## 2019-04-01 ENCOUNTER — Ambulatory Visit: Payer: BC Managed Care – PPO

## 2019-05-10 ENCOUNTER — Telehealth: Payer: Self-pay

## 2019-05-10 NOTE — Telephone Encounter (Signed)

## 2019-05-13 ENCOUNTER — Ambulatory Visit (INDEPENDENT_AMBULATORY_CARE_PROVIDER_SITE_OTHER): Payer: BC Managed Care – PPO | Admitting: Internal Medicine

## 2019-05-13 ENCOUNTER — Other Ambulatory Visit: Payer: Self-pay

## 2019-05-13 ENCOUNTER — Encounter: Payer: Self-pay | Admitting: Internal Medicine

## 2019-05-13 VITALS — BP 136/91 | HR 84 | Temp 97.7°F | Resp 17 | Wt 268.6 lb

## 2019-05-13 DIAGNOSIS — I10 Essential (primary) hypertension: Secondary | ICD-10-CM

## 2019-05-13 NOTE — Patient Instructions (Signed)
Please take your blood pressure at home in the morning prior to drinking any caffeine. Sit with both feet flat on the floor. Rest for at least 2 minutes prior to checking your blood pressure to allow it to normalize after any movement. Write this number down on your log and please bring your log to every office visit. Call the clinic if you have numbers consistently greater than 150 on the top or 100 on the bottom.    DASH Eating Plan DASH stands for "Dietary Approaches to Stop Hypertension." The DASH eating plan is a healthy eating plan that has been shown to reduce high blood pressure (hypertension). It may also reduce your risk for type 2 diabetes, heart disease, and stroke. The DASH eating plan may also help with weight loss. What are tips for following this plan?  General guidelines  Avoid eating more than 2,300 mg (milligrams) of salt (sodium) a day. If you have hypertension, you may need to reduce your sodium intake to 1,500 mg a day.  Limit alcohol intake to no more than 1 drink a day for nonpregnant women and 2 drinks a day for men. One drink equals 12 oz of beer, 5 oz of wine, or 1 oz of hard liquor.  Work with your health care provider to maintain a healthy body weight or to lose weight. Ask what an ideal weight is for you.  Get at least 30 minutes of exercise that causes your heart to beat faster (aerobic exercise) most days of the week. Activities may include walking, swimming, or biking.  Work with your health care provider or diet and nutrition specialist (dietitian) to adjust your eating plan to your individual calorie needs. Reading food labels   Check food labels for the amount of sodium per serving. Choose foods with less than 5 percent of the Daily Value of sodium. Generally, foods with less than 300 mg of sodium per serving fit into this eating plan.  To find whole grains, look for the word "whole" as the first word in the ingredient list. Shopping  Buy products  labeled as "low-sodium" or "no salt added."  Buy fresh foods. Avoid canned foods and premade or frozen meals. Cooking  Avoid adding salt when cooking. Use salt-free seasonings or herbs instead of table salt or sea salt. Check with your health care provider or pharmacist before using salt substitutes.  Do not fry foods. Cook foods using healthy methods such as baking, boiling, grilling, and broiling instead.  Cook with heart-healthy oils, such as olive, canola, soybean, or sunflower oil. Meal planning  Eat a balanced diet that includes: ? 5 or more servings of fruits and vegetables each day. At each meal, try to fill half of your plate with fruits and vegetables. ? Up to 6-8 servings of whole grains each day. ? Less than 6 oz of lean meat, poultry, or fish each day. A 3-oz serving of meat is about the same size as a deck of cards. One egg equals 1 oz. ? 2 servings of low-fat dairy each day. ? A serving of nuts, seeds, or beans 5 times each week. ? Heart-healthy fats. Healthy fats called Omega-3 fatty acids are found in foods such as flaxseeds and coldwater fish, like sardines, salmon, and mackerel.  Limit how much you eat of the following: ? Canned or prepackaged foods. ? Food that is high in trans fat, such as fried foods. ? Food that is high in saturated fat, such as fatty meat. ? Sweets,   desserts, sugary drinks, and other foods with added sugar. ? Full-fat dairy products.  Do not salt foods before eating.  Try to eat at least 2 vegetarian meals each week.  Eat more home-cooked food and less restaurant, buffet, and fast food.  When eating at a restaurant, ask that your food be prepared with less salt or no salt, if possible. What foods are recommended? The items listed may not be a complete list. Talk with your dietitian about what dietary choices are best for you. Grains Whole-grain or whole-wheat bread. Whole-grain or whole-wheat pasta. Brown rice. Oatmeal. Quinoa. Bulgur.  Whole-grain and low-sodium cereals. Pita bread. Low-fat, low-sodium crackers. Whole-wheat flour tortillas. Vegetables Fresh or frozen vegetables (raw, steamed, roasted, or grilled). Low-sodium or reduced-sodium tomato and vegetable juice. Low-sodium or reduced-sodium tomato sauce and tomato paste. Low-sodium or reduced-sodium canned vegetables. Fruits All fresh, dried, or frozen fruit. Canned fruit in natural juice (without added sugar). Meat and other protein foods Skinless chicken or turkey. Ground chicken or turkey. Pork with fat trimmed off. Fish and seafood. Egg whites. Dried beans, peas, or lentils. Unsalted nuts, nut butters, and seeds. Unsalted canned beans. Lean cuts of beef with fat trimmed off. Low-sodium, lean deli meat. Dairy Low-fat (1%) or fat-free (skim) milk. Fat-free, low-fat, or reduced-fat cheeses. Nonfat, low-sodium ricotta or cottage cheese. Low-fat or nonfat yogurt. Low-fat, low-sodium cheese. Fats and oils Soft margarine without trans fats. Vegetable oil. Low-fat, reduced-fat, or light mayonnaise and salad dressings (reduced-sodium). Canola, safflower, olive, soybean, and sunflower oils. Avocado. Seasoning and other foods Herbs. Spices. Seasoning mixes without salt. Unsalted popcorn and pretzels. Fat-free sweets. What foods are not recommended? The items listed may not be a complete list. Talk with your dietitian about what dietary choices are best for you. Grains Baked goods made with fat, such as croissants, muffins, or some breads. Dry pasta or rice meal packs. Vegetables Creamed or fried vegetables. Vegetables in a cheese sauce. Regular canned vegetables (not low-sodium or reduced-sodium). Regular canned tomato sauce and paste (not low-sodium or reduced-sodium). Regular tomato and vegetable juice (not low-sodium or reduced-sodium). Pickles. Olives. Fruits Canned fruit in a light or heavy syrup. Fried fruit. Fruit in cream or butter sauce. Meat and other protein  foods Fatty cuts of meat. Ribs. Fried meat. Bacon. Sausage. Bologna and other processed lunch meats. Salami. Fatback. Hotdogs. Bratwurst. Salted nuts and seeds. Canned beans with added salt. Canned or smoked fish. Whole eggs or egg yolks. Chicken or turkey with skin. Dairy Whole or 2% milk, cream, and half-and-half. Whole or full-fat cream cheese. Whole-fat or sweetened yogurt. Full-fat cheese. Nondairy creamers. Whipped toppings. Processed cheese and cheese spreads. Fats and oils Butter. Stick margarine. Lard. Shortening. Ghee. Bacon fat. Tropical oils, such as coconut, palm kernel, or palm oil. Seasoning and other foods Salted popcorn and pretzels. Onion salt, garlic salt, seasoned salt, table salt, and sea salt. Worcestershire sauce. Tartar sauce. Barbecue sauce. Teriyaki sauce. Soy sauce, including reduced-sodium. Steak sauce. Canned and packaged gravies. Fish sauce. Oyster sauce. Cocktail sauce. Horseradish that you find on the shelf. Ketchup. Mustard. Meat flavorings and tenderizers. Bouillon cubes. Hot sauce and Tabasco sauce. Premade or packaged marinades. Premade or packaged taco seasonings. Relishes. Regular salad dressings. Where to find more information:  National Heart, Lung, and Blood Institute: www.nhlbi.nih.gov  American Heart Association: www.heart.org Summary  The DASH eating plan is a healthy eating plan that has been shown to reduce high blood pressure (hypertension). It may also reduce your risk for type 2 diabetes, heart   disease, and stroke.  With the DASH eating plan, you should limit salt (sodium) intake to 2,300 mg a day. If you have hypertension, you may need to reduce your sodium intake to 1,500 mg a day.  When on the DASH eating plan, aim to eat more fresh fruits and vegetables, whole grains, lean proteins, low-fat dairy, and heart-healthy fats.  Work with your health care provider or diet and nutrition specialist (dietitian) to adjust your eating plan to your  individual calorie needs. This information is not intended to replace advice given to you by your health care provider. Make sure you discuss any questions you have with your health care provider. Document Revised: 03/03/2017 Document Reviewed: 03/14/2016 Elsevier Patient Education  2020 Elsevier Inc.  

## 2019-05-13 NOTE — Progress Notes (Signed)
  Subjective:    Sierra Diaz - 44 y.o. female MRN [id number]  Date of birth: 02-12-1976  HPI  Sierra Diaz is here for follow up of HTN.  Chronic HTN Disease Monitoring:  Home BP Monitoring - Does not monitor. Has ability to because her parents have cuffs.  Chest pain- no  Dyspnea- no Headache - no  Medications: Amlodipine 10 mg  Compliance- yes, most of the time remembers does say she will occasionally forget  Lightheadedness- no  Edema- no      Health Maintenance:  There are no preventive care reminders to display for this patient.  -  reports that she has never smoked. She has never used smokeless tobacco. - Review of Systems: Per HPI. - Past Medical History: There are no problems to display for this patient.  - Medications: reviewed and updated   Objective:   Physical Exam BP (!) 136/91   Pulse 84   Temp 97.7 F (36.5 C) (Temporal)   Resp 17   Wt 268 lb 9.6 oz (121.8 kg)   SpO2 98%   BMI 46.11 kg/m  Physical Exam  Constitutional: She is oriented to person, place, and time and well-developed, well-nourished, and in no distress. No distress.  HENT:  Head: Normocephalic and atraumatic.  Eyes: Conjunctivae and EOM are normal.  Cardiovascular: Normal rate.  Pulmonary/Chest: Effort normal. No respiratory distress.  Musculoskeletal:        General: Normal range of motion.  Neurological: She is alert and oriented to person, place, and time.  Skin: Skin is warm and dry. She is not diaphoretic.  Psychiatric: Affect and judgment normal.       Assessment & Plan:   1. Essential hypertension Discussed tighter goal of <130/80 but given lack of co-morbidities could use goal of <140/90. Patient much closer to goal BP than in past. She would like to avoid second medication for now.  Counseled on low-sodium, DASH diet, medication compliance, 150 minutes of moderate intensity exercise per week. Discussed medication compliance, adverse  effects. Return in 3 months for BP follow up. She is up to date on labs.    [name], D.O. 05/13/2019, 3:13 PM Primary Care at [facility]

## 2019-08-12 ENCOUNTER — Encounter: Payer: Self-pay | Admitting: Internal Medicine

## 2019-08-12 ENCOUNTER — Telehealth (INDEPENDENT_AMBULATORY_CARE_PROVIDER_SITE_OTHER): Payer: BC Managed Care – PPO | Admitting: Internal Medicine

## 2019-08-12 DIAGNOSIS — I10 Essential (primary) hypertension: Secondary | ICD-10-CM | POA: Diagnosis not present

## 2019-08-12 DIAGNOSIS — N63 Unspecified lump in unspecified breast: Secondary | ICD-10-CM

## 2019-08-12 DIAGNOSIS — M79671 Pain in right foot: Secondary | ICD-10-CM | POA: Diagnosis not present

## 2019-08-12 DIAGNOSIS — G8929 Other chronic pain: Secondary | ICD-10-CM

## 2019-08-12 MED ORDER — MELOXICAM 15 MG PO TABS: 15.0000 mg | ORAL_TABLET | Freq: Every day | ORAL | 1 refills | Status: DC

## 2019-08-12 NOTE — Progress Notes (Signed)
Virtual Visit via Telephone Note  I connected with Marvelyn F Hillock, on 08/12/2019 at 3:10 PM by telephone due to the COVID-19 pandemic and verified that I am speaking with the correct person using two identifiers.   Consent: I discussed the limitations, risks, security and privacy concerns of performing an evaluation and management service by telephone and the availability of in person appointments. I also discussed with the patient that there may be a patient responsible charge related to this service. The patient expressed understanding and agreed to proceed.   Location of Patient: Home   Location of Provider: Clinic    Persons participating in Telemedicine visit: Rayah F Nugent Heide Guile Dr. Juleen China      History of Present Illness: Patient has a visit to follow up on HTN.   Chronic HTN Disease Monitoring:  Home BP Monitoring - Does not monitor.  Chest pain- no  Dyspnea- no Headache - no  Medications: Amlodipine 10 mg  Compliance- yes Lightheadedness- no  Edema- no   Right Heel Pain: It has been chronic, was initially intermittent but now has become more daily if she is active. Worse with prolonged periods of standing or first thing in the morning. Seems if she is able to rest, her pain will subside. She has been taking Ibuprofen at home with some relief of her symptoms.    Past Medical History:  Diagnosis Date  . Chronic knee pain   . Obesity    Allergies  Allergen Reactions  . Ibuprofen     "makes my blood pressure shoot up"    Current Outpatient Medications on File Prior to Visit  Medication Sig Dispense Refill  . amLODipine (NORVASC) 10 MG tablet Take 1 tablet (10 mg total) by mouth daily. To lower blood pressure 30 tablet 4  . ferrous sulfate 325 (65 FE) MG tablet Take 325 mg by mouth daily with breakfast.     No current facility-administered medications on file prior to visit.    Observations/Objective: NAD. Speaking  clearly.  Work of breathing normal.  Alert and oriented. Mood appropriate.   Assessment and Plan: 1. Chronic pain of right heel Suspect plantar fasciitis given history and location of pain described. Will start Mobic and discussed RICE. Recommended calf stretches as well as plantar fascia stretches. Discussed use of tennis ball or frozen water ball to massage the area. If no improvement, would refer to PT.  - meloxicam (MOBIC) 15 MG tablet; Take 1 tablet (15 mg total) by mouth daily.  Dispense: 30 tablet; Refill: 1  2. Breast lump Patient reports that since her annual exam with OB she has discovered a right sided breast lump that feels like a marble. She has mammogram ordered/scheduled through her OB provider already. I recommended alerting them of new finding to see if additional imaging, such as ultrasound, was warranted with her upcoming appointment.   3. Essential hypertension Does not monitor at home so unable to assess BP trend. Patient is asymptomatic. Continue Amlodipine.      Follow Up Instructions: 3 month f/u for HTN    I discussed the assessment and treatment plan with the patient. The patient was provided an opportunity to ask questions and all were answered. The patient agreed with the plan and demonstrated an understanding of the instructions.   The patient was advised to call back or seek an in-person evaluation if the symptoms worsen or if the condition fails to improve as anticipated.     I provided 18 minutes total  of non-face-to-face time during this encounter including median intraservice time, reviewing previous notes, investigations, ordering medications, medical decision making, coordinating care and patient verbalized understanding at the end of the visit.    [name], D.O. Primary Care at [facility]  08/12/2019, 3:10 PM

## 2019-08-16 DIAGNOSIS — N63 Unspecified lump in unspecified breast: Secondary | ICD-10-CM | POA: Diagnosis not present

## 2019-08-19 ENCOUNTER — Other Ambulatory Visit: Payer: Self-pay | Admitting: Obstetrics and Gynecology

## 2019-08-19 DIAGNOSIS — N6310 Unspecified lump in the right breast, unspecified quadrant: Secondary | ICD-10-CM

## 2019-08-26 DIAGNOSIS — Z01419 Encounter for gynecological examination (general) (routine) without abnormal findings: Secondary | ICD-10-CM | POA: Diagnosis not present

## 2019-08-26 DIAGNOSIS — Z309 Encounter for contraceptive management, unspecified: Secondary | ICD-10-CM | POA: Diagnosis not present

## 2019-08-26 DIAGNOSIS — Z6841 Body Mass Index (BMI) 40.0 and over, adult: Secondary | ICD-10-CM | POA: Diagnosis not present

## 2019-08-26 DIAGNOSIS — E669 Obesity, unspecified: Secondary | ICD-10-CM | POA: Diagnosis not present

## 2019-08-28 ENCOUNTER — Other Ambulatory Visit: Payer: BC Managed Care – PPO

## 2019-08-30 ENCOUNTER — Ambulatory Visit
Admission: RE | Admit: 2019-08-30 | Discharge: 2019-08-30 | Disposition: A | Discharge status: Home / Self Care | Payer: BC Managed Care – PPO | Source: Ambulatory Visit | Attending: Obstetrics and Gynecology | Admitting: Obstetrics and Gynecology

## 2019-08-30 ENCOUNTER — Other Ambulatory Visit: Payer: Self-pay

## 2019-08-30 DIAGNOSIS — N6310 Unspecified lump in the right breast, unspecified quadrant: Secondary | ICD-10-CM

## 2019-08-30 DIAGNOSIS — N6011 Diffuse cystic mastopathy of right breast: Secondary | ICD-10-CM | POA: Diagnosis not present

## 2019-08-30 DIAGNOSIS — R928 Other abnormal and inconclusive findings on diagnostic imaging of breast: Secondary | ICD-10-CM | POA: Diagnosis not present

## 2019-09-17 DIAGNOSIS — Z30431 Encounter for routine checking of intrauterine contraceptive device: Secondary | ICD-10-CM | POA: Diagnosis not present

## 2019-09-20 ENCOUNTER — Ambulatory Visit
Admission: RE | Admit: 2019-09-20 | Discharge: 2019-09-20 | Disposition: A | Discharge status: Home / Self Care | Payer: BC Managed Care – PPO | Source: Ambulatory Visit | Attending: Obstetrics and Gynecology | Admitting: Obstetrics and Gynecology

## 2019-09-20 ENCOUNTER — Other Ambulatory Visit: Payer: Self-pay | Admitting: Obstetrics and Gynecology

## 2019-09-20 DIAGNOSIS — Z975 Presence of (intrauterine) contraceptive device: Secondary | ICD-10-CM

## 2019-10-10 DIAGNOSIS — Z30430 Encounter for insertion of intrauterine contraceptive device: Secondary | ICD-10-CM | POA: Diagnosis not present

## 2019-10-10 DIAGNOSIS — Z3202 Encounter for pregnancy test, result negative: Secondary | ICD-10-CM | POA: Diagnosis not present

## 2019-11-15 ENCOUNTER — Encounter: Payer: Self-pay | Admitting: Internal Medicine

## 2019-11-15 ENCOUNTER — Ambulatory Visit (INDEPENDENT_AMBULATORY_CARE_PROVIDER_SITE_OTHER): Payer: BC Managed Care – PPO | Admitting: Internal Medicine

## 2019-11-15 ENCOUNTER — Other Ambulatory Visit: Payer: Self-pay

## 2019-11-15 VITALS — BP 134/90 | HR 85 | Temp 97.2°F | Resp 17 | Wt 277.0 lb

## 2019-11-15 DIAGNOSIS — I10 Essential (primary) hypertension: Secondary | ICD-10-CM

## 2019-11-15 MED ORDER — AMLODIPINE BESYLATE 10 MG PO TABS: 10.0000 mg | ORAL_TABLET | Freq: Every day | ORAL | 5 refills | Status: DC

## 2019-11-15 NOTE — Progress Notes (Signed)
  Subjective:    Sierra Diaz - 44 y.o. female MRN [id number]  Date of birth: Jun 07, 1975  HPI  Sierra Diaz is here for HTN f/u.  Chronic HTN Disease Monitoring:  Home BP Monitoring - Does not monitor at home. Reports BPs have been good at recent OB-GYN visits.  Chest pain- no  Dyspnea- no Headache - no  Medications: Amlodipine 10 mg  Compliance- yes Lightheadedness- no  Edema- no        Health Maintenance:  Health Maintenance Due  Topic Date Due  . Hepatitis C Screening  Never done  . COVID-19 Vaccine (1) Never done  . INFLUENZA VACCINE  11/03/2019    -  reports that she has never smoked. She has never used smokeless tobacco. - Review of Systems: Per HPI. - Past Medical History: There are no problems to display for this patient.  - Medications: reviewed and updated   Objective:   Physical Exam BP 134/90   Pulse 85   Temp (!) 97.2 F (36.2 C) (Temporal)   Resp 17   Wt 277 lb (125.6 kg)   SpO2 98%   BMI 47.55 kg/m  Physical Exam Constitutional:      General: She is not in acute distress.    Appearance: She is not diaphoretic.  Cardiovascular:     Rate and Rhythm: Normal rate.  Pulmonary:     Effort: Pulmonary effort is normal. No respiratory distress.  Musculoskeletal:        General: Normal range of motion.  Skin:    General: Skin is warm and dry.  Neurological:     Mental Status: She is alert and oriented to person, place, and time.  Psychiatric:        Mood and Affect: Affect normal.        Judgment: Judgment normal.            Assessment & Plan:   1. Essential hypertension BP close to goal and has been well controlled at OB-GYN visits this summer with readings 120/80s. Continue current therapy. Follow up in about 4-6 months for HTN and lab monitoring.  Counseled on blood pressure goal of less than 130/80, low-sodium, DASH diet, medication compliance, 150 minutes of moderate intensity exercise per  week. Discussed medication compliance, adverse effects. - amLODipine (NORVASC) 10 MG tablet; Take 1 tablet (10 mg total) by mouth daily. To lower blood pressure  Dispense: 30 tablet; Refill: 5    [name], D.O. 11/15/2019, 10:38 AM Primary Care at [facility]

## 2019-11-21 DIAGNOSIS — Z3009 Encounter for other general counseling and advice on contraception: Secondary | ICD-10-CM | POA: Diagnosis not present

## 2019-11-21 DIAGNOSIS — Z7689 Persons encountering health services in other specified circumstances: Secondary | ICD-10-CM | POA: Diagnosis not present

## 2019-11-21 DIAGNOSIS — Z3040 Encounter for surveillance of contraceptives, unspecified: Secondary | ICD-10-CM | POA: Diagnosis not present

## 2020-04-15 ENCOUNTER — Other Ambulatory Visit: Payer: Self-pay | Admitting: Internal Medicine

## 2020-04-15 DIAGNOSIS — I10 Essential (primary) hypertension: Secondary | ICD-10-CM

## 2020-04-17 ENCOUNTER — Encounter: Payer: BC Managed Care – PPO | Admitting: Internal Medicine

## 2020-04-17 DIAGNOSIS — Z1152 Encounter for screening for COVID-19: Secondary | ICD-10-CM | POA: Diagnosis not present

## 2020-05-29 ENCOUNTER — Encounter: Payer: BC Managed Care – PPO | Admitting: Internal Medicine

## 2021-01-31 IMAGING — CR DG ABDOMEN 1V
2 series · 2 of 2 positions shown · non-contrast
Comparison: None.

CLINICAL DATA: Intrauterine device in place.

EXAM:
ABDOMEN - 1 VIEW

[t abdomen supine (1 of 2)]
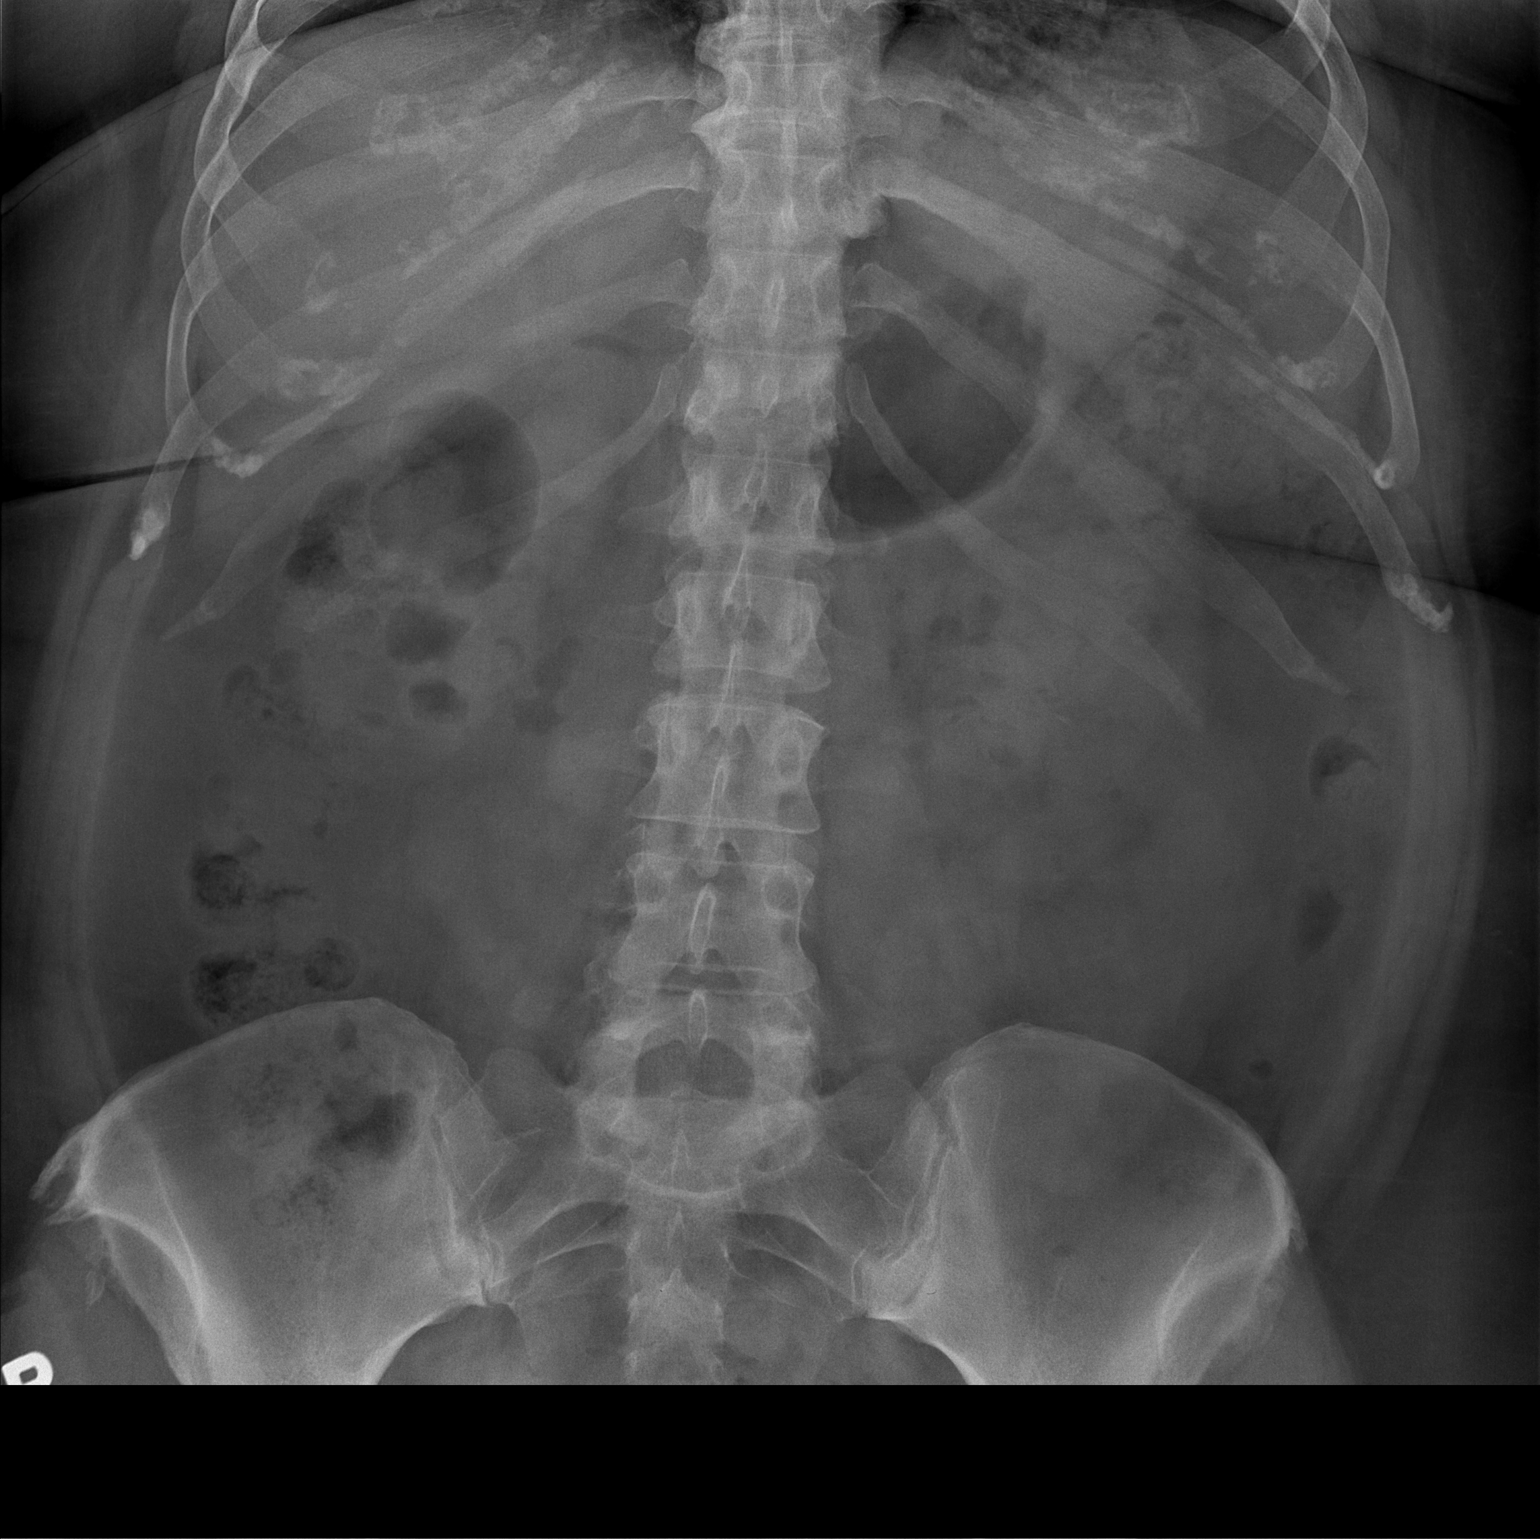

[t abdomen supine (2 of 2)]
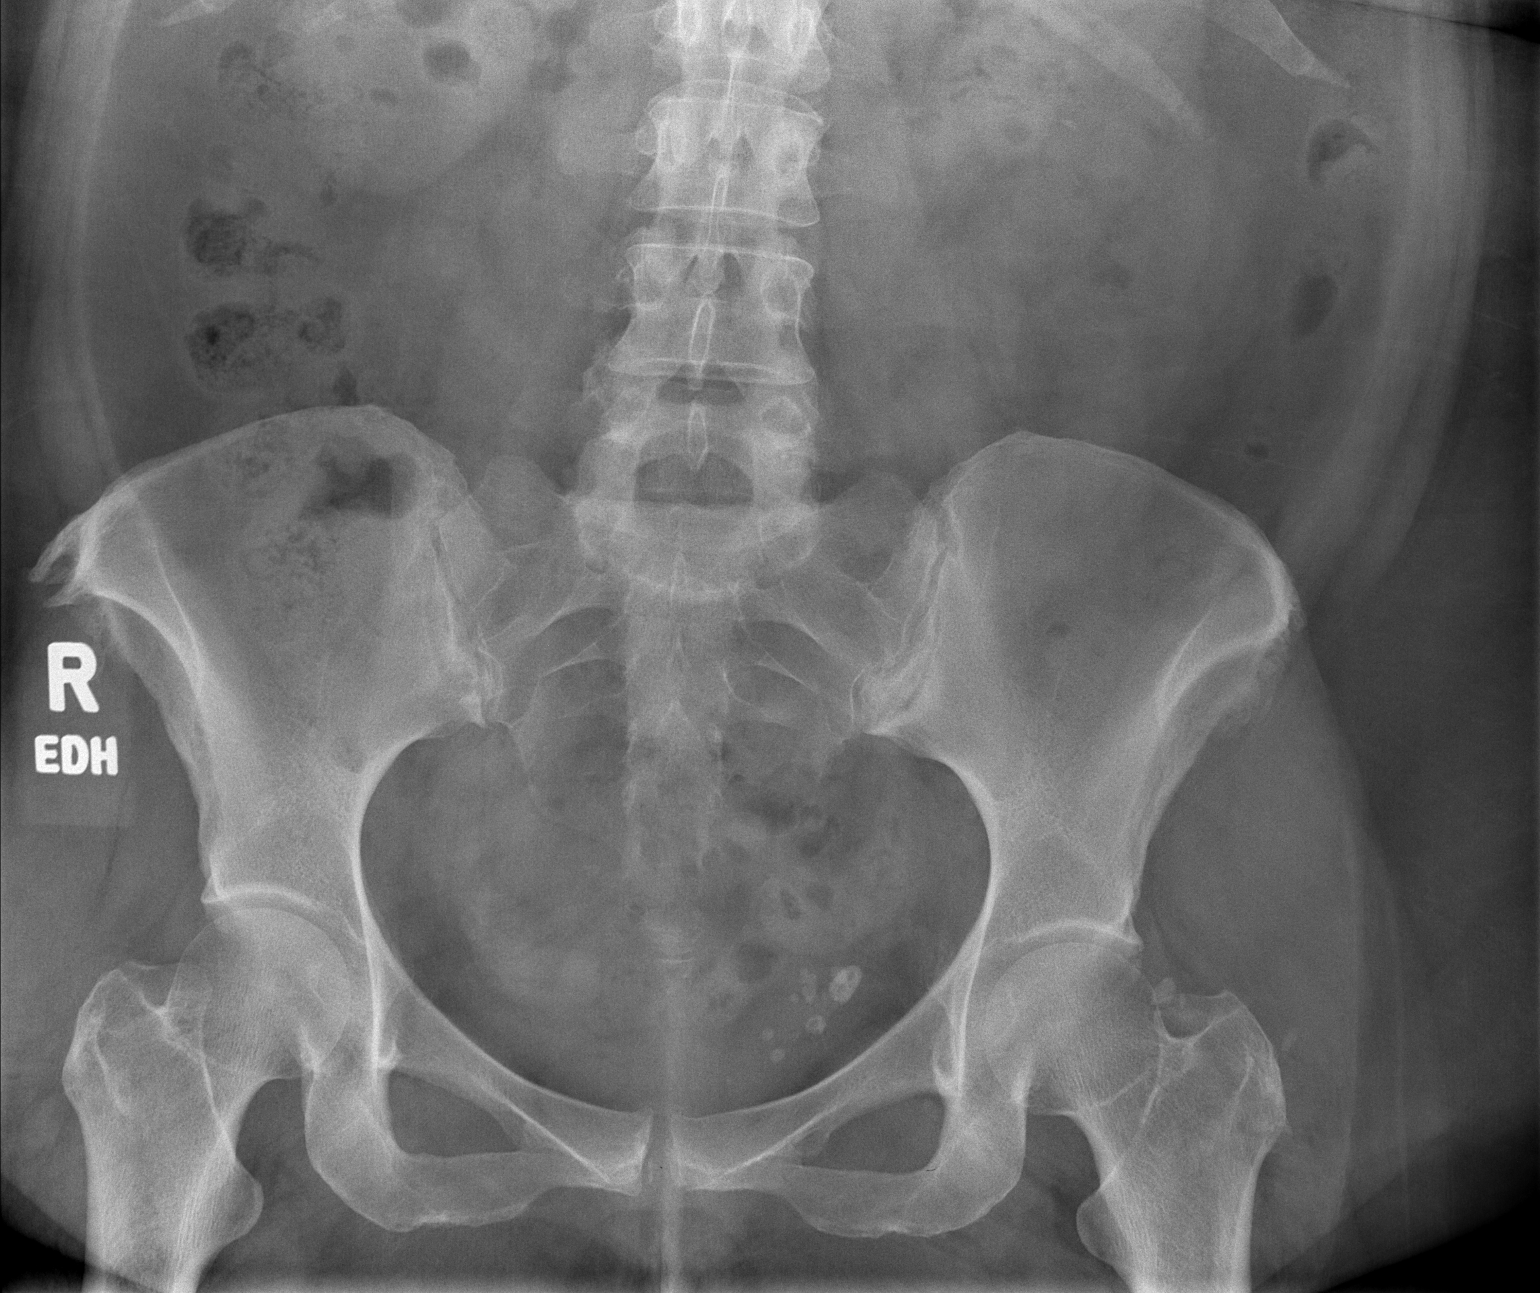

[2 of 2 positions shown; findings below may reference images not displayed]

FINDINGS: No intrauterine device is present. Specifically no IUD within the
pelvis or abdominal cavity. Normal bowel gas pattern. Small volume
of colonic stool. No radiopaque calculi. Left pelvic calcifications
are typical of phleboliths. No osseous abnormalities are seen.
IMPRESSION: 1. No intrauterine device present in the abdomen or pelvis.
2. Normal bowel gas pattern.

## 2022-09-30 NOTE — Progress Notes (Unsigned)
Patient ID: Sierra Diaz, female    DOB: 1975/09/15  MRN: [id number]  CC: Chronic Care Management  Subjective: Sierra Diaz is a 47 y.o. female who presents for chronic care management.   Her concerns today include:  Reports she has not taken Amlodipine in several months. She does not monitor salt intake. She does not exercise outside of her normal routine. Home blood pressures similar to today's blood pressure reading. She does not complain of red flag symptoms such as but not limited to chest pain, shortness of breath, worst headache of life, nausea/vomiting. No further issues/concerns for discussion today.    There are no problems to display for this patient.    Current Outpatient Medications on File Prior to Visit  Medication Sig Dispense Refill   ferrous sulfate 325 (65 FE) MG tablet Take 325 mg by mouth daily with breakfast.     levonorgestrel (MIRENA, 52 MG,) 20 MCG/24HR IUD Mirena 20 mcg/24 hours (6 yrs) 52 mg intrauterine device  Take by intrauterine route.     No current facility-administered medications on file prior to visit.    No Active Allergies   Social History   Socioeconomic History   Marital status: Single    Spouse name: Not on file   Number of children: Not on file   Years of education: Not on file   Highest education level: Not on file  Occupational History   Not on file  Tobacco Use   Smoking status: Never   Smokeless tobacco: Never  Vaping Use   Vaping Use: Never used  Substance and Sexual Activity   Alcohol use: Never   Drug use: Never   Sexual activity: Not on file  Other Topics Concern   Not on file  Social History Narrative   Not on file   Social Determinants of Health   Financial Resource Strain: Not on file  Food Insecurity: Not on file  Transportation Needs: Not on file  Physical Activity: Not on file  Stress: Not on file  Social Connections: Not on file  Intimate Partner Violence: Not on file    Family History   Problem Relation Age of Onset   Hypertension Mother    Diabetes Mother    Hypertension Father    Diabetes Father    Diabetes Brother    Diabetes Brother     Past Surgical History:  Procedure Laterality Date   CESAREAN SECTION     CESAREAN SECTION N/A    Phreesia 04/15/2020    ROS: Review of Systems Negative except as stated above  PHYSICAL EXAM: BP (!) 145/88   Pulse 65   Temp (!) 97.5 F (36.4 C) (Oral)   Ht 5\' 4"  (1.626 m)   Wt 280 lb 9.6 oz (127.3 kg)   SpO2 95%   BMI 48.16 kg/m   Physical Exam HENT:     Head: Normocephalic and atraumatic.     Nose: Nose normal.     Mouth/Throat:     Mouth: Mucous membranes are moist.     Pharynx: Oropharynx is clear.  Eyes:     Extraocular Movements: Extraocular movements intact.     Conjunctiva/sclera: Conjunctivae normal.     Pupils: Pupils are equal, round, and reactive to light.  Cardiovascular:     Rate and Rhythm: Normal rate and regular rhythm.     Pulses: Normal pulses.     Heart sounds: Normal heart sounds.  Pulmonary:     Effort: Pulmonary effort is normal.  Breath sounds: Normal breath sounds.  Musculoskeletal:     Cervical back: Normal range of motion and neck supple.  Neurological:     General: No focal deficit present.     Mental Status: She is alert and oriented to person, place, and time.  Psychiatric:        Mood and Affect: Mood normal.        Behavior: Behavior normal.     ASSESSMENT AND PLAN: 1. Primary hypertension - Blood pressure not at goal during today's visit. Patient asymptomatic without chest pressure, chest pain, palpitations, shortness of breath, worst headache of life, and any additional red flag symptoms. - Hydralazine administered in office with improvement of blood pressure from 171/112 to 145/88. - Resume Amlodipine as prescribed.  - Routine screening.  - Counseled on blood pressure goal of less than 130/80, low-sodium, DASH diet, medication compliance, and 150 minutes of  moderate intensity exercise per week as tolerated. Counseled on medication adherence and adverse effects. - Follow-up with primary provider in 2 weeks or sooner if needed for blood pressure check.  - Basic Metabolic Panel - amLODipine (NORVASC) 10 MG tablet; Take 1 tablet (10 mg total) by mouth daily.  Dispense: 90 tablet; Refill: 0 - hydrALAZINE (APRESOLINE) tablet 25 mg     Patient was given the opportunity to ask questions.  Patient verbalized understanding of the plan and was able to repeat key elements of the plan. Patient was given clear instructions to go to Emergency Department or return to medical center if symptoms don't improve, worsen, or new problems develop.The patient verbalized understanding.   Orders Placed This Encounter  Procedures   Basic Metabolic Panel     Requested Prescriptions   Signed Prescriptions Disp Refills   amLODipine (NORVASC) 10 MG tablet 90 tablet 0    Sig: Take 1 tablet (10 mg total) by mouth daily.    Return in about 2 weeks (around 10/18/2022) for Follow-Up or next available chronic care mgmt .  Rema Fendt, NP

## 2022-10-04 ENCOUNTER — Ambulatory Visit (INDEPENDENT_AMBULATORY_CARE_PROVIDER_SITE_OTHER): Payer: BC Managed Care – PPO | Admitting: Family

## 2022-10-04 ENCOUNTER — Encounter: Payer: Self-pay | Admitting: Family

## 2022-10-04 VITALS — BP 145/88 | HR 65 | Temp 97.5°F | Ht 64.0 in | Wt 280.6 lb

## 2022-10-04 DIAGNOSIS — I10 Essential (primary) hypertension: Secondary | ICD-10-CM

## 2022-10-04 MED ORDER — AMLODIPINE BESYLATE 10 MG PO TABS
10.0000 mg | ORAL_TABLET | Freq: Every day | ORAL | 0 refills | Status: AC
Start: 2022-10-04 — End: ?

## 2022-10-04 MED ORDER — HYDRALAZINE HCL 10 MG PO TABS
25.0000 mg | ORAL_TABLET | Freq: Once | ORAL | Status: DC
Start: 2022-10-05 — End: 2022-10-04

## 2022-10-04 MED ORDER — HYDRALAZINE HCL 10 MG PO TABS
25.0000 mg | ORAL_TABLET | Freq: Once | ORAL | Status: AC
Start: 2022-10-04 — End: 2022-10-04
  Administered 2022-10-04: 25 mg via ORAL

## 2022-10-04 NOTE — Progress Notes (Signed)
Pt needs amlodipine refilled  

## 2022-10-05 ENCOUNTER — Telehealth: Payer: Self-pay | Admitting: Internal Medicine

## 2022-10-05 LAB — BASIC METABOLIC PANEL
BUN/Creatinine Ratio: 21 (ref 9–23)
BUN: 15 mg/dL (ref 6–24)
CO2: 20 mmol/L (ref 20–29)
Calcium: 8.7 mg/dL (ref 8.7–10.2)
Chloride: 105 mmol/L (ref 96–106)
Creatinine, Ser: 0.73 mg/dL (ref 0.57–1.00)
Glucose: 78 mg/dL (ref 70–99)
Potassium: 4.8 mmol/L (ref 3.5–5.2)
Sodium: 141 mmol/L (ref 134–144)
eGFR: 102 mL/min/{1.73_m2} (ref >59)

## 2022-10-05 NOTE — Telephone Encounter (Signed)
[facility] called asking about an extra blood tube sent in and wanted clarification. The tube was sent in error per CMA K.B.

## 2022-12-30 ENCOUNTER — Other Ambulatory Visit: Payer: Self-pay

## 2022-12-30 ENCOUNTER — Other Ambulatory Visit: Payer: Self-pay | Admitting: Family

## 2022-12-30 DIAGNOSIS — I10 Essential (primary) hypertension: Secondary | ICD-10-CM

## 2022-12-30 MED ORDER — AMLODIPINE BESYLATE 10 MG PO TABS
10.0000 mg | ORAL_TABLET | Freq: Every day | ORAL | 0 refills | Status: DC
Start: 2022-12-30 — End: 2023-03-31

## 2023-03-31 ENCOUNTER — Other Ambulatory Visit: Payer: Self-pay | Admitting: Family

## 2023-03-31 DIAGNOSIS — I10 Essential (primary) hypertension: Secondary | ICD-10-CM

## 2023-09-19 ENCOUNTER — Other Ambulatory Visit: Payer: Self-pay | Admitting: Family Medicine

## 2023-09-19 DIAGNOSIS — I10 Essential (primary) hypertension: Secondary | ICD-10-CM

## 2024-01-09 ENCOUNTER — Ambulatory Visit (INDEPENDENT_AMBULATORY_CARE_PROVIDER_SITE_OTHER): Admitting: Family Medicine

## 2024-01-09 VITALS — BP 155/92 | HR 74 | Temp 98.2°F | Resp 16 | Ht 64.0 in | Wt 306.2 lb

## 2024-01-09 DIAGNOSIS — E66813 Obesity, class 3: Secondary | ICD-10-CM | POA: Diagnosis not present

## 2024-01-09 DIAGNOSIS — I10 Essential (primary) hypertension: Secondary | ICD-10-CM

## 2024-01-09 DIAGNOSIS — Z6841 Body Mass Index (BMI) 40.0 and over, adult: Secondary | ICD-10-CM

## 2024-01-09 MED ORDER — AMLODIPINE BESYLATE 10 MG PO TABS: 10.0000 mg | ORAL_TABLET | Freq: Every day | ORAL | 0 refills | Status: DC

## 2024-01-09 MED ORDER — LOSARTAN POTASSIUM 50 MG PO TABS: 50.0000 mg | ORAL_TABLET | Freq: Every day | ORAL | 0 refills | Status: DC

## 2024-01-09 NOTE — Progress Notes (Signed)
 Established Patient Office Visit  Subjective    Patient ID: Sierra Diaz, female    DOB: [birth year]  Age: 48 y.o. MRN: [id number]  CC:  Chief Complaint  Patient presents with   Establish Care    HPI Tyshell F Catlin presents for follow up of hypertension. Patient reports that she has not been real compliant with her meds but she denies acute complaints.   Outpatient Encounter Medications as of 01/09/2024  Medication Sig   levonorgestrel  (MIRENA , 52 MG,) 20 MCG/24HR IUD Mirena  20 mcg/24 hours (6 yrs) 52 mg intrauterine device  Take by intrauterine route.   losartan (COZAAR) 50 MG tablet Take 1 tablet (50 mg total) by mouth daily.   [DISCONTINUED] amLODipine  (NORVASC ) 10 MG tablet TAKE 1 TABLET BY MOUTH EVERY DAY   amLODipine  (NORVASC ) 10 MG tablet Take 1 tablet (10 mg total) by mouth daily.   ferrous sulfate 325 (65 FE) MG tablet Take 325 mg by mouth daily with breakfast.   [DISCONTINUED] amLODipine  (NORVASC ) 10 MG tablet TAKE 1 TABLET BY MOUTH EVERY DAY   No facility-administered encounter medications on file as of 01/09/2024.    Past Medical History:  Diagnosis Date   Arthritis    [employer] 04/15/2020   Chronic knee pain    Hypertension    [employer] 04/15/2020   Obesity     Past Surgical History:  Procedure Laterality Date   CESAREAN SECTION     CESAREAN SECTION N/A    [employer] 04/15/2020    Family History  Problem Relation Age of Onset   Hypertension Mother    Diabetes Mother    Hypertension Father    Diabetes Father    Diabetes Brother    Diabetes Brother     Social History   Socioeconomic History   Marital status: Single    Spouse name: Not on file   Number of children: Not on file   Years of education: Not on file   Highest education level: Some college, no degree  Occupational History   Not on file  Tobacco Use   Smoking status: Never   Smokeless tobacco: Never  Vaping Use   Vaping status: Never Used  Substance and Sexual Activity    Alcohol use: Never   Drug use: Never   Sexual activity: Not on file  Other Topics Concern   Not on file  Social History Narrative   Not on file   Social Drivers of Health   Financial Resource Strain: Medium Risk (01/09/2024)   Overall Financial Resource Strain (CARDIA)    Difficulty of Paying Living Expenses: Somewhat hard  Food Insecurity: No Food Insecurity (01/09/2024)   Hunger Vital Sign    Worried About Running Out of Food in the Last Year: Never true    Ran Out of Food in the Last Year: Never true  Transportation Needs: No Transportation Needs (01/09/2024)   PRAPARE - Administrator, Civil Service (Medical): No    Lack of Transportation (Non-Medical): No  Physical Activity: Inactive (01/09/2024)   Exercise Vital Sign    Days of Exercise per Week: 0 days    Minutes of Exercise per Session: Not on file  Stress: Stress Concern Present (01/09/2024)   [employer] of Occupational Health - Occupational Stress Questionnaire    Feeling of Stress: To some extent  Social Connections: Socially Isolated (01/09/2024)   Social Connection and Isolation Panel    Frequency of Communication with Friends and Family: Once a week  Frequency of Social Gatherings with Friends and Family: More than three times a week    Attends Religious Services: Never    Database administrator or Organizations: No    Attends Engineer, structural: Not on file    Marital Status: Never married  Intimate Partner Violence: Not on file    Review of Systems  All other systems reviewed and are negative.       Objective    BP (!) 155/92   Pulse 74   Temp 98.2 F (36.8 C) (Oral)   Resp 16   Ht 5' 4 (1.626 m)   Wt (!) 306 lb 3.2 oz (138.9 kg)   SpO2 97%   BMI 52.56 kg/m   Physical Exam Vitals and nursing note reviewed.  Constitutional:      General: She is not in acute distress.    Appearance: She is obese.  Cardiovascular:     Rate and Rhythm: Normal rate and regular  rhythm.  Pulmonary:     Effort: Pulmonary effort is normal.     Breath sounds: Normal breath sounds.  Abdominal:     Palpations: Abdomen is soft.     Tenderness: There is no abdominal tenderness.  Neurological:     General: No focal deficit present.     Mental Status: She is alert and oriented to person, place, and time.         Assessment & Plan:   Uncontrolled hypertension -     amLODIPine  Besylate; Take 1 tablet (10 mg total) by mouth daily.  Dispense: 90 tablet; Refill: 0  Class 3 severe obesity due to excess calories with serious comorbidity and body mass index (BMI) of 50.0 to 59.9 in adult [facility])  Other orders -     Losartan Potassium; Take 1 tablet (50 mg total) by mouth daily.  Dispense: 90 tablet; Refill: 0   Will add losartan 50 mg to regimen. Discussed compliance.   Return in about 4 weeks (around 02/06/2024) for follow up, chronic med issues.   [name] SQUIBB, MD

## 2024-01-09 NOTE — Progress Notes (Deleted)
 New Patient Office Visit  Subjective    Patient ID: Sierra Diaz, female    DOB: [birth year]  Age: 48 y.o. MRN: [id number]  CC:  Chief Complaint  Patient presents with   Establish Care    HPI Sierra Diaz presents to establish care.  ***  Outpatient Encounter Medications as of 01/09/2024  Medication Sig   amLODipine  (NORVASC ) 10 MG tablet TAKE 1 TABLET BY MOUTH EVERY DAY   levonorgestrel  (MIRENA , 52 MG,) 20 MCG/24HR IUD Mirena  20 mcg/24 hours (6 yrs) 52 mg intrauterine device  Take by intrauterine route.   ferrous sulfate 325 (65 FE) MG tablet Take 325 mg by mouth daily with breakfast.   [DISCONTINUED] amLODipine  (NORVASC ) 10 MG tablet TAKE 1 TABLET BY MOUTH EVERY DAY   No facility-administered encounter medications on file as of 01/09/2024.    Past Medical History:  Diagnosis Date   Arthritis    [employer]ia 04/15/2020   Chronic knee pain    Hypertension    [employer]ia 04/15/2020   Obesity     Past Surgical History:  Procedure Laterality Date   CESAREAN SECTION     CESAREAN SECTION N/A    [employer] 04/15/2020    Family History  Problem Relation Age of Onset   Hypertension Mother    Diabetes Mother    Hypertension Father    Diabetes Father    Diabetes Brother    Diabetes Brother     Social History   Socioeconomic History   Marital status: Single    Spouse name: Not on file   Number of children: Not on file   Years of education: Not on file   Highest education level: Some college, no degree  Occupational History   Not on file  Tobacco Use   Smoking status: Never   Smokeless tobacco: Never  Vaping Use   Vaping status: Never Used  Substance and Sexual Activity   Alcohol use: Never   Drug use: Never   Sexual activity: Not on file  Other Topics Concern   Not on file  Social History Narrative   Not on file   Social Drivers of Health   Financial Resource Strain: Medium Risk (01/09/2024)   Overall Financial Resource Strain (CARDIA)     Difficulty of Paying Living Expenses: Somewhat hard  Food Insecurity: No Food Insecurity (01/09/2024)   Hunger Vital Sign    Worried About Running Out of Food in the Last Year: Never true    Ran Out of Food in the Last Year: Never true  Transportation Needs: No Transportation Needs (01/09/2024)   PRAPARE - Administrator, Civil Service (Medical): No    Lack of Transportation (Non-Medical): No  Physical Activity: Inactive (01/09/2024)   Exercise Vital Sign    Days of Exercise per Week: 0 days    Minutes of Exercise per Session: Not on file  Stress: Stress Concern Present (01/09/2024)   [employer] of Occupational Health - Occupational Stress Questionnaire    Feeling of Stress: To some extent  Social Connections: Socially Isolated (01/09/2024)   Social Connection and Isolation Panel    Frequency of Communication with Friends and Family: Once a week    Frequency of Social Gatherings with Friends and Family: More than three times a week    Attends Religious Services: Never    Database administrator or Organizations: No    Attends Engineer, structural: Not on file    Marital Status: Never married  Intimate  Partner Violence: Not on file    ROS      Objective   BP (!) 163/101   Pulse 74   Temp 98.2 F (36.8 C) (Oral)   Resp 16   Ht 5' 4 (1.626 m)   Wt (!) 306 lb 3.2 oz (138.9 kg)   SpO2 97%   BMI 52.56 kg/m   Physical Exam  {Labs (Optional):23779}    Assessment & Plan:   There are no diagnoses linked to this encounter.   No follow-ups on file.   [name] SQUIBB, MD

## 2024-01-13 ENCOUNTER — Encounter: Payer: Self-pay | Admitting: Family Medicine

## 2024-02-06 ENCOUNTER — Ambulatory Visit (INDEPENDENT_AMBULATORY_CARE_PROVIDER_SITE_OTHER): Admitting: Family Medicine

## 2024-02-06 ENCOUNTER — Encounter: Payer: Self-pay | Admitting: Family Medicine

## 2024-02-06 VITALS — BP 129/84 | HR 76 | Ht 64.0 in | Wt 307.4 lb

## 2024-02-06 DIAGNOSIS — I10 Essential (primary) hypertension: Secondary | ICD-10-CM

## 2024-02-06 DIAGNOSIS — E66813 Obesity, class 3: Secondary | ICD-10-CM

## 2024-02-06 DIAGNOSIS — Z6841 Body Mass Index (BMI) 40.0 and over, adult: Secondary | ICD-10-CM

## 2024-02-06 MED ORDER — LOSARTAN POTASSIUM 50 MG PO TABS: 50.0000 mg | ORAL_TABLET | Freq: Every day | ORAL | 1 refills | Status: AC

## 2024-02-06 MED ORDER — AMLODIPINE BESYLATE 10 MG PO TABS
10.0000 mg | ORAL_TABLET | Freq: Every day | ORAL | 1 refills | Status: AC
Start: 2024-02-06 — End: ?

## 2024-02-06 NOTE — Progress Notes (Signed)
 Established Patient Office Visit  Subjective    Patient ID: Sierra Diaz, female    DOB: 09-10-1975  Age: 48 y.o. MRN: [id number]  CC:  Chief Complaint  Patient presents with   Medical Management of Chronic Issues    HPI Saja F Mcchristian presents for follow up of hypertension. Patient reports med complaince and denies acute complaints.   Outpatient Encounter Medications as of 02/06/2024  Medication Sig   ferrous sulfate 325 (65 FE) MG tablet Take 325 mg by mouth daily with breakfast.   levonorgestrel  (MIRENA , 52 MG,) 20 MCG/24HR IUD Mirena  20 mcg/24 hours (6 yrs) 52 mg intrauterine device  Take by intrauterine route.   [DISCONTINUED] amLODipine  (NORVASC ) 10 MG tablet Take 1 tablet (10 mg total) by mouth daily.   [DISCONTINUED] losartan (COZAAR) 50 MG tablet Take 1 tablet (50 mg total) by mouth daily.   amLODipine  (NORVASC ) 10 MG tablet Take 1 tablet (10 mg total) by mouth daily.   losartan (COZAAR) 50 MG tablet Take 1 tablet (50 mg total) by mouth daily.   No facility-administered encounter medications on file as of 02/06/2024.    Past Medical History:  Diagnosis Date   Arthritis    [facility]ia 04/15/2020   Chronic knee pain    Hypertension    [employer]ia 04/15/2020   Obesity     Past Surgical History:  Procedure Laterality Date   CESAREAN SECTION     CESAREAN SECTION N/A    [facility]ia 04/15/2020    Family History  Problem Relation Age of Onset   Hypertension Mother    Diabetes Mother    Hypertension Father    Diabetes Father    Diabetes Brother    Diabetes Brother     Social History   Socioeconomic History   Marital status: Single    Spouse name: Not on file   Number of children: Not on file   Years of education: Not on file   Highest education level: Some college, no degree  Occupational History   Not on file  Tobacco Use   Smoking status: Never   Smokeless tobacco: Never  Vaping Use   Vaping status: Never Used  Substance and Sexual Activity    Alcohol use: Never   Drug use: Never   Sexual activity: Not on file  Other Topics Concern   Not on file  Social History Narrative   Not on file   Social Drivers of Health   Financial Resource Strain: Medium Risk (01/09/2024)   Overall Financial Resource Strain (CARDIA)    Difficulty of Paying Living Expenses: Somewhat hard  Food Insecurity: No Food Insecurity (01/09/2024)   Hunger Vital Sign    Worried About Running Out of Food in the Last Year: Never true    Ran Out of Food in the Last Year: Never true  Transportation Needs: No Transportation Needs (01/09/2024)   PRAPARE - Administrator, Civil Service (Medical): No    Lack of Transportation (Non-Medical): No  Physical Activity: Insufficiently Active (02/06/2024)   Exercise Vital Sign    Days of Exercise per Week: 2 days    Minutes of Exercise per Session: 20 min  Stress: Stress Concern Present (01/09/2024)   [employer] of Occupational Health - Occupational Stress Questionnaire    Feeling of Stress: To some extent  Social Connections: Socially Isolated (01/09/2024)   Social Connection and Isolation Panel    Frequency of Communication with Friends and Family: Once a week    Frequency of  Social Gatherings with Friends and Family: More than three times a week    Attends Religious Services: Never    Database Administrator or Organizations: No    Attends Engineer, Structural: Not on file    Marital Status: Never married  Intimate Partner Violence: Not on file    Review of Systems  All other systems reviewed and are negative.       Objective    BP 129/84   Pulse 76   Ht 5' 4 (1.626 m)   Wt (!) 307 lb 6.4 oz (139.4 kg)   SpO2 96%   BMI 52.77 kg/m   Physical Exam Vitals and nursing note reviewed.  Constitutional:      General: She is not in acute distress.    Appearance: She is obese.  Cardiovascular:     Rate and Rhythm: Normal rate and regular rhythm.  Pulmonary:     Effort: Pulmonary  effort is normal.     Breath sounds: Normal breath sounds.  Abdominal:     Palpations: Abdomen is soft.     Tenderness: There is no abdominal tenderness.  Neurological:     General: No focal deficit present.     Mental Status: She is alert and oriented to person, place, and time.         Assessment & Plan:   Essential hypertension -     amLODIPine  Besylate; Take 1 tablet (10 mg total) by mouth daily.  Dispense: 90 tablet; Refill: 1  Class 3 severe obesity due to excess calories with serious comorbidity and body mass index (BMI) of 50.0 to 59.9 in adult [facility])  Other orders -     Losartan Potassium; Take 1 tablet (50 mg total) by mouth daily.  Dispense: 90 tablet; Refill: 1     Return in about 6 months (around 08/05/2024) for follow up, chronic med issues.   [name] SQUIBB, MD

## 2024-08-05 ENCOUNTER — Ambulatory Visit: Admitting: Family Medicine
